# Patient Record
Sex: Male | Born: 1975
Health system: Southern US, Community
[De-identification: ages and names within clinical notes are randomized; demographics above are authoritative.]

## PROBLEM LIST (undated history)

## (undated) DIAGNOSIS — R51 Headache: Secondary | ICD-10-CM

## (undated) DIAGNOSIS — R519 Headache, unspecified: Secondary | ICD-10-CM

## (undated) DIAGNOSIS — T7840XA Allergy, unspecified, initial encounter: Secondary | ICD-10-CM

## (undated) DIAGNOSIS — B019 Varicella without complication: Secondary | ICD-10-CM

## (undated) HISTORY — PX: TONSILLECTOMY: SUR1361

## (undated) HISTORY — DX: Headache, unspecified: R51.9

## (undated) HISTORY — DX: Allergy, unspecified, initial encounter: T78.40XA

## (undated) HISTORY — DX: Headache: R51

## (undated) HISTORY — DX: Varicella without complication: B01.9

---

## 2008-01-19 ENCOUNTER — Ambulatory Visit: Payer: Self-pay | Admitting: Family Medicine

## 2008-01-19 LAB — CONVERTED CEMR LAB: Rapid Strep: POSITIVE

## 2008-03-07 ENCOUNTER — Ambulatory Visit: Payer: Self-pay | Admitting: Family Medicine

## 2008-03-07 LAB — CONVERTED CEMR LAB: Rapid Strep: NEGATIVE

## 2008-11-01 ENCOUNTER — Emergency Department (HOSPITAL_BASED_OUTPATIENT_CLINIC_OR_DEPARTMENT_OTHER): Admission: EM | Admit: 2008-11-01 | Discharge: 2008-11-01 | Payer: Self-pay | Admitting: Emergency Medicine

## 2008-11-01 ENCOUNTER — Ambulatory Visit: Payer: Self-pay | Admitting: Diagnostic Radiology

## 2008-11-02 ENCOUNTER — Emergency Department (HOSPITAL_BASED_OUTPATIENT_CLINIC_OR_DEPARTMENT_OTHER): Admission: EM | Admit: 2008-11-02 | Discharge: 2008-11-02 | Payer: Self-pay | Admitting: Emergency Medicine

## 2009-02-13 ENCOUNTER — Ambulatory Visit: Payer: Self-pay | Admitting: Family Medicine

## 2009-05-17 ENCOUNTER — Ambulatory Visit: Payer: Self-pay | Admitting: Internal Medicine

## 2009-05-21 ENCOUNTER — Ambulatory Visit: Payer: Self-pay | Admitting: Internal Medicine

## 2009-05-21 LAB — CONVERTED CEMR LAB
AST: 16 units/L (ref 0–37)
Alkaline Phosphatase: 75 units/L (ref 39–117)
BUN: 16 mg/dL (ref 6–23)
Bilirubin, Direct: 0.1 mg/dL (ref 0.0–0.3)
CO2: 29 meq/L (ref 19–32)
Calcium: 9.3 mg/dL (ref 8.4–10.5)
Creatinine, Ser: 1.03 mg/dL (ref 0.40–1.50)
Glucose, Bld: 96 mg/dL (ref 70–99)
HDL: 46 mg/dL (ref >39)
HIV-1 RNA Quant, Log: <1.68 (ref <1.68)
Indirect Bilirubin: 0.3 mg/dL (ref 0.0–0.9)
Total Bilirubin: 0.4 mg/dL (ref 0.3–1.2)

## 2009-05-23 ENCOUNTER — Encounter: Payer: Self-pay | Admitting: Internal Medicine

## 2009-05-25 ENCOUNTER — Encounter: Payer: Self-pay | Admitting: Internal Medicine

## 2009-05-25 ENCOUNTER — Telehealth: Payer: Self-pay | Admitting: Internal Medicine

## 2009-06-26 ENCOUNTER — Ambulatory Visit: Payer: Self-pay | Admitting: Internal Medicine

## 2009-06-26 DIAGNOSIS — K5289 Other specified noninfective gastroenteritis and colitis: Secondary | ICD-10-CM

## 2010-04-30 NOTE — Letter (Signed)
Summary: Work Dietitian at [facility] [employer]. [address]   [place], [place] [address]   Phone: [phone]  Fax: [phone]      Today's Date: May 17, 2009     Name of Patient: Peter White   The above named patient had a medical visit today.Please take this into consideration when reviewing the time away from work.  Special Instructions:  [ X] None  [  ] To be off the remainder of today, returning to the normal work / school schedule tomorrow.  [  ] To be off until the next scheduled appointment on ______________________.  [  ] Other ________________________________________________________________ ________________________________________________________________________    Sincerely yours,   Glendell Docker CMA Dr. Thomos Lemons

## 2010-04-30 NOTE — Progress Notes (Signed)
Summary: Lab Results  Phone Note Outgoing Call   Summary of Call: call pt - STD testing - HIV, RPR - negative Initial call taken by: D. Thomos Lemons DO,  May 25, 2009 12:29 PM  Follow-up for Phone Call        patient advised per Dr Artist Pais instructions Follow-up by: Glendell Docker CMA,  May 25, 2009 2:07 PM

## 2010-04-30 NOTE — Consult Note (Signed)
Summary: [facility]  [facility]   Imported By: Lanelle Bal 05/29/2009 11:31:50  _____________________________________________________________________  External Attachment:    Type:   Image     Comment:   External Document

## 2010-04-30 NOTE — Assessment & Plan Note (Signed)
Summary: Abd pain/N&V  last night/hea   Vital Signs:  Patient profile:   35 year old male Height:      69 inches Weight:      148.75 pounds BMI:     22.05 O2 Sat:      100 % on Room air Temp:     98.0 degrees F oral Pulse rate:   87 / minute Pulse rhythm:   regular Resp:     18 per minute BP sitting:   100 / 60  (right arm) Cuff size:   regular  Vitals Entered By: Glendell Docker CMA (June 26, 2009 9:06 AM)  O2 Flow:  Room air CC: Rm 2- Vomiting, Diarrhea Comments sudden onset last night of nausea, vomiting , & diarrhea x 2 each, and headaches, no self care measures   Primary Care Provider:  Dondra Spry DO  CC:  Rm 2- Vomiting and Diarrhea.  History of Present Illness: 35 y/o white male complains of nausea and vomiting. symptoms started midnight - felt nauseated and vomited x 1 diarrhea this AM - loose stools no bloody in stool Headache this AM no unusual food intake -  wife and pt had chicken and noodles he is able to keep down liquids  son had double ear infection last week    Allergies (verified): No Known Drug Allergies  Past History:  Past Medical History: NONE   PMH-FH-SH reviewed for relevance  Family History: mother: age 59- healthy  father: age 77 sister age 57- healthy Family History of Cardiovascular disorder Father has CAD , had rheumatic fever aunts on mother's side - breast ca   Social History: Married (wife Peter White) Never Smoked Alcohol use-no Drug use-no  Occupation:  Curator truck   Physical Exam  General:  alert, well-developed, and well-nourished.   Lungs:  normal respiratory effort and normal breath sounds.   Heart:  normal rate, regular rhythm, and no gallop.   Abdomen:  soft, non-tender, normal bowel sounds, no masses, no guarding, no rigidity, and no rebound tenderness.     Impression & Recommendations:  Problem # 1:  GASTROENTERITIS (ICD-558.9) 35 y/o with nausea, vomiting and diarrhea.  mild dehydration.      His updated medication list for this problem includes:    Ondansetron Hcl 4 Mg Tabs (Ondansetron hcl) ..... One by mouth three times a day as needed nausea  Discussed use of medication and role of diet. Encouraged clear liquids and electrolyte replacement fluids. Instructed to call if any signs of worsening dehydration.   Complete Medication List: 1)  Ondansetron Hcl 4 Mg Tabs (Ondansetron hcl) .... One by mouth three times a day as needed nausea  Patient Instructions: 1)  Increase fluid intake as directed. 2)  BRAT diet for 1-2 days 3)  Call our office if your symptoms do not  improve or gets worse. Prescriptions: ONDANSETRON HCL 4 MG TABS (ONDANSETRON HCL) one by mouth three times a day as needed nausea  #30 x 0   Entered and Authorized by:   D. Thomos Lemons DO   Signed by:   D. Thomos Lemons DO on 06/26/2009   Method used:   Electronically to        [facility] [address] (retail)       [address]       [place], [place]  [address]       Ph: [phone] or [phone]  Fax: [phone]   RxID:   [id number]   Current Allergies (reviewed today): No known allergies

## 2010-04-30 NOTE — Assessment & Plan Note (Signed)
Summary: new to est/mhf   Vital Signs:  Patient profile:   35 year old male Height:      69 inches Weight:      144.25 pounds BMI:     21.38 O2 Sat:      99 % on Room air Temp:     98.3 degrees F oral Pulse rate:   64 / minute Pulse rhythm:   regular Resp:     16 per minute BP sitting:   110 / 60  (right arm) Cuff size:   regular  Vitals Entered By: Glendell Docker CMA (May 17, 2009 1:36 PM)  O2 Flow:  Room air  Primary Care Provider:  D. Thomos Lemons DO  CC:  New Patient.  History of Present Illness: New Patient   35 y/o white male for routine cpx.  no hx of chronic illness  once per month - sharp pain under right rib cage.  symptoms provoked when pt stretches symptoms last 1-2 hrs  works as Curator for trucks  he reports small bumps on right side of scrotum x 1-2 months.  no burning sensation he denies hx of STD's  Preventive Screening-Counseling & Management  Alcohol-Tobacco     Alcohol drinks/day: 0     Smoking Status: never  Caffeine-Diet-Exercise     Caffeine use/day: 3 beverages per week     Does Patient Exercise: no  Allergies (verified): No Known Drug Allergies  Past History:  Past Medical History: NONE   Past Surgical History: Tonsillectomy  PSH reviewed for relevance  Family History: mother: age 41- healthy  father: age 50 sister age 38- healthy Family History of Cardiovascular disorder Father has CAD , had rheumatic fever aunts on mother's side - breast ca  Social History: Married (wife Winton Offord) Never Smoked Alcohol use-no Drug use-no  Occupation:  Curator truck Caffeine use/day:  3 beverages per week Does Patient Exercise:  no  Review of Systems  The patient denies weight loss, weight gain, chest pain, dyspnea on exertion, prolonged cough, abdominal pain, melena, hematochezia, severe indigestion/heartburn, depression, and testicular masses.    Physical Exam  General:  alert, well-developed, and well-nourished.    Head:  normocephalic and atraumatic.   Eyes:  vision grossly intact, pupils equal, pupils round, and pupils reactive to light.   Ears:  R ear normal and L ear normal.   Mouth:  Oral mucosa and oropharynx without lesions or exudates.  Teeth in good repair. Neck:  No deformities, masses, or tenderness noted. Lungs:  Normal respiratory effort, chest expands symmetrically. Lungs are clear to auscultation, no crackles or wheezes. Heart:  Normal rate and regular rhythm. S1 and S2 normal without gallop, murmur, click, rub or other extra sounds. Abdomen:  soft, non-tender, normal bowel sounds, no masses, no hepatomegaly, and no splenomegaly.   Genitalia:  circumcised.  scattered papillar lesion right scrotum and base of penis   Impression & Recommendations:  Problem # 1:  PREVENTIVE HEALTH CARE (ICD-V70.0) Assessment New Reviewed adult health maintenance protocols.  I  suggested annual flu vaccine.  arrange cholesterol screening  Td Booster: Historical (09/19/2008)   Flu Vax: Declined (05/17/2009)     Problem # 2:  CONDYLOMA ACUMINATA (ICD-078.11) scattered papillary lesions on right scrotum and penis.  Probable genital warts.  Refer to urologist for further eval and  tx.  Orders: Urology Referral (Urology)  Patient Instructions: 1)  Please schedule a follow-up appointment as needed. 2)  BMP prior to visit, ICD-9: v70 3)  Lipid  Panel prior to visit, ICD-9: v70 4)  Hepatic Panel prior to visit, ICD-9: v70 5)  Please return for fasting lab work within one week  Current Allergies (reviewed today): No known allergies    Immunization History:  Tetanus/Td Immunization History:    Tetanus/Td:  historical (09/19/2008)  Influenza Immunization History:    Influenza:  declined (05/17/2009)   Contraindications/Deferment of Procedures/Staging:    Test/Procedure: FLU VAX    Reason for deferment: patient declined

## 2010-04-30 NOTE — Letter (Signed)
Summary: Work Dietitian at [facility]. [address]   [place], [place] [address]   Phone: [phone]  Fax: [phone]      Today's Date: June 26, 2009    Name of Patient: Peter White  The above named patient had a medical visit today .Please take this into consideration when reviewing the time away from work.  Special Instructions:  [  None  Arly.Keller  ] To be off the remainder of today, returning to the normal work  schedule tomorrow.  [  ] To be off until the next scheduled appointment on ______________________.  [  ] Other ________________________________________________________________ ________________________________________________________________________     Sincerely yours,   Glendell Docker CMA Dr Thomos Lemons

## 2010-04-30 NOTE — Letter (Signed)
   [place] at [facility] [address] [place], [place]  [address]  [place] Phone: [phone]      May 25, 2009   [facility] [address] [place], [place] [address]  RE:  LAB RESULTS  Dear  Mr. Kluesner,  The following is an interpretation of your most recent lab tests.  Please take note of any instructions provided or changes to medications that have resulted from your lab work.  ELECTROLYTES:  Good - no changes needed  KIDNEY FUNCTION TESTS:  Good - no changes needed  LIVER FUNCTION TESTS:  Good - no changes needed  LIPID PANEL:  Good - no changes needed Triglyceride: 40   Cholesterol: 138   LDL: 84   HDL: 46   Chol/HDL%:  3.0 Ratio       Sincerely Yours,    Dr.  Thomos Lemons

## 2010-11-29 ENCOUNTER — Inpatient Hospital Stay (INDEPENDENT_AMBULATORY_CARE_PROVIDER_SITE_OTHER)
Admission: RE | Admit: 2010-11-29 | Discharge: 2010-11-29 | Disposition: A | Discharge status: Home / Self Care | Payer: 59 | Source: Ambulatory Visit | Attending: Family Medicine | Admitting: Family Medicine

## 2010-11-29 ENCOUNTER — Encounter: Payer: Self-pay | Admitting: Family Medicine

## 2010-11-29 DIAGNOSIS — J069 Acute upper respiratory infection, unspecified: Secondary | ICD-10-CM

## 2010-11-29 LAB — CONVERTED CEMR LAB: Rapid Strep: NEGATIVE

## 2010-12-23 ENCOUNTER — Other Ambulatory Visit: Payer: Self-pay | Admitting: Family Medicine

## 2010-12-23 ENCOUNTER — Encounter: Payer: Self-pay | Admitting: Family Medicine

## 2010-12-23 ENCOUNTER — Inpatient Hospital Stay (INDEPENDENT_AMBULATORY_CARE_PROVIDER_SITE_OTHER)
Admission: RE | Admit: 2010-12-23 | Discharge: 2010-12-23 | Disposition: A | Discharge status: Home / Self Care | Payer: 59 | Source: Ambulatory Visit | Attending: Family Medicine | Admitting: Family Medicine

## 2010-12-23 ENCOUNTER — Ambulatory Visit
Admission: RE | Admit: 2010-12-23 | Discharge: 2010-12-23 | Disposition: A | Discharge status: Home / Self Care | Payer: 59 | Source: Ambulatory Visit | Attending: Family Medicine | Admitting: Family Medicine

## 2010-12-23 DIAGNOSIS — R05 Cough: Secondary | ICD-10-CM

## 2010-12-23 DIAGNOSIS — J209 Acute bronchitis, unspecified: Secondary | ICD-10-CM

## 2011-03-03 NOTE — Progress Notes (Signed)
Summary: COUGH..WSE (rm 2)   Vital Signs:  Patient Profile:   35 Years Old Male CC:      Cough x 3 weeks Height:     69 inches Weight:      146 pounds O2 Sat:      98 % O2 treatment:    Room Air Temp:     98.8 degrees F oral Pulse rate:   67 / minute Resp:     16 per minute BP sitting:   124 / 73  (left arm) Cuff size:   regular  Vitals Entered By: Lajean Saver RN (December 23, 2010 2:42 PM)                  Updated Prior Medication List: No Medications Current Allergies: No known allergies History of Present Illness Chief Complaint: Cough x 3 weeks History of Present Illness:  Subjective:  Patient complains of persistent cough for 3 weeks.  Initial sore throat resolved.  Cough is often non-productive and usually worse at night.  5 days ago he coughed until he vomited, and he often has prolonged coughing episodes.  No shortness of breath, but does have some wheezing at night.  No fevers, chills, and sweats.  He has just developed some mild nasal congestion with clear discharge.  REVIEW OF SYSTEMS Constitutional Symptoms      Denies fever, chills, night sweats, weight loss, weight gain, and fatigue.  Eyes       Denies change in vision, eye pain, eye discharge, glasses, contact lenses, and eye surgery. Ear/Nose/Throat/Mouth       Denies hearing loss/aids, change in hearing, ear pain, ear discharge, dizziness, frequent runny nose, frequent nose bleeds, sinus problems, sore throat, hoarseness, and tooth pain or bleeding.  Respiratory       Complains of dry cough.      Denies productive cough, wheezing, shortness of breath, asthma, bronchitis, and emphysema/COPD.  Cardiovascular       Denies murmurs, chest pain, and tires easily with exhertion.    Gastrointestinal       Denies stomach pain, nausea/vomiting, diarrhea, constipation, blood in bowel movements, and indigestion. Genitourniary       Denies painful urination, blood or discharge from penis, kidney stones, and  loss of urinary control. Neurological       Denies paralysis, seizures, and fainting/blackouts. Musculoskeletal       Denies muscle pain, joint pain, joint stiffness, decreased range of motion, redness, swelling, muscle weakness, and gout.  Skin       Denies bruising, unusual mles/lumps or sores, and hair/skin or nail changes.  Psych       Denies mood changes, temper/anger issues, anxiety/stress, speech problems, depression, and sleep problems. Other Comments: taken mucinex and cold/flu OTC   Past History:  Past Medical History: Reviewed history from 06/26/2009 and no changes required. NONE    Past Surgical History: Reviewed history from 05/17/2009 and no changes required. Tonsillectomy   Family History: Reviewed history from 06/26/2009 and no changes required. mother: age 38- healthy  father: age 32 sister age 67- healthy Family History of Cardiovascular disorder Father has CAD , had rheumatic fever aunts on mother's side - breast ca   Social History: Reviewed history from 06/26/2009 and no changes required. Married (wife Izic Stfort) Never Smoked Alcohol use-no Drug use-no  Occupation:  Curator truck    Objective:  Appearance:  Patient appears healthy, stated age, and in no acute distress  Eyes:  Pupils are equal, round, and  reactive to light and accomodation.  Extraocular movement is intact.  Conjunctivae are not inflamed.  Ears:  Canals normal.  Tympanic membranes normal.   Nose:  Mildly congested turbinates.  No sinus tenderness  Pharynx:  Normal  Neck:  Supple.  No adenopathy is present.  Lungs:  Bibasilar wheezes and rhonchi,  worse on left base.  Breath sounds are equal.  Heart:  Regular rate and rhythm without murmurs, rubs, or gallops.  Abdomen:  Nontender without masses or hepatosplenomegaly.  Bowel sounds are present.  No CVA or flank tenderness.  Extremities:  No edema.   Skin:  No rash Chest X-ray:  IMPRESSION: Normal exam.   Assessment New  Problems: ACUTE BRONCHITIS (ICD-466.0) COUGH (ICD-786.2)   Plan New Medications/Changes: BENZONATATE 200 MG CAPS (BENZONATATE) One by mouth hs as needed cough  #12 x 0, 12/23/2010, Donna Christen MD CLARITHROMYCIN 500 MG TABS (CLARITHROMYCIN) One Tab by mouth two times a day  #20 x 0, 12/23/2010, Donna Christen MD  New Orders: T-Chest x-ray, 2 views [71020] Pulse Oximetry [94760] Est. Patient Level IV [82956] Planning Comments:   Begin Biaxin, expectorant, cough suppressant at bedtime.  Increase fluid intake Followup with PCP if not improving 7 to 10 days  Recommend flu shot when well.   The patient and/or caregiver has been counseled thoroughly with regard to medications prescribed including dosage, schedule, interactions, rationale for use, and possible side effects and they verbalize understanding.  Diagnoses and expected course of recovery discussed and will return if not improved as expected or if the condition worsens. Patient and/or caregiver verbalized understanding.  Prescriptions: BENZONATATE 200 MG CAPS (BENZONATATE) One by mouth hs as needed cough  #12 x 0   Entered and Authorized by:   Donna Christen MD   Signed by:   Donna Christen MD on 12/23/2010   Method used:   Print then Give to Patient   RxID:   [id number] CLARITHROMYCIN 500 MG TABS (CLARITHROMYCIN) One Tab by mouth two times a day  #20 x 0   Entered and Authorized by:   Donna Christen MD   Signed by:   Donna Christen MD on 12/23/2010   Method used:   Print then Give to Patient   RxID:   [id number]   Patient Instructions: 1)  Take Mucinex (guaifenesin) twice daily for congestion. 2)  Increase fluid intake  3)  Followup with family doctor if not improving 7 to 10 days.   Orders Added: 1)  T-Chest x-ray, 2 views [71020] 2)  Pulse Oximetry [94760] 3)  Est. Patient Level IV [66440]

## 2011-03-03 NOTE — Progress Notes (Signed)
Summary: Possible Strep Throat Room 5   Vital Signs:  Patient Profile:   35 Years Old Male CC:      Sore throat, headache, congestion since last night, Child tested positive for strep yesterday Height:     69 inches Weight:      146 pounds O2 Sat:      98 % O2 treatment:    Room Air Temp:     98.4 degrees F oral Pulse rate:   69 / minute Pulse rhythm:   regular Resp:     14 per minute BP sitting:   110 / 75  (left arm) Cuff size:   regular  Vitals Entered By: Emilio Math (November 29, 2010 10:00 AM)                  Current Allergies: No known allergies History of Present Illness Chief Complaint: Sore throat, headache, congestion since last night, Child tested positive for strep yesterday History of Present Illness:  Subjective: Patient complains of sore throat and URI symptoms that started yesterday.  His child had tested positive for strep + sore throat + cough No pleuritic pain No wheezing +nasal congestion ? post-nasal drainage No sinus pain/pressure No itchy/red eyes No earache No hemoptysis No SOB No fever/chills No nausea No vomiting No abdominal pain No diarrhea No skin rashes + fatigue No myalgias No headache Used OTC meds without relief   REVIEW OF SYSTEMS Constitutional Symptoms      Denies fever, chills, night sweats, weight loss, weight gain, and fatigue.  Eyes       Denies change in vision, eye pain, eye discharge, glasses, contact lenses, and eye surgery. Ear/Nose/Throat/Mouth       Complains of frequent runny nose, sinus problems, and sore throat.      Denies hearing loss/aids, change in hearing, ear pain, ear discharge, dizziness, frequent nose bleeds, hoarseness, and tooth pain or bleeding.  Respiratory       Complains of dry cough.      Denies productive cough, wheezing, shortness of breath, asthma, bronchitis, and emphysema/COPD.  Cardiovascular       Denies murmurs, chest pain, and tires easily with exhertion.     Gastrointestinal       Denies stomach pain, nausea/vomiting, diarrhea, constipation, blood in bowel movements, and indigestion. Genitourniary       Denies painful urination, kidney stones, and loss of urinary control. Neurological       Complains of headaches.      Denies paralysis, seizures, and fainting/blackouts. Musculoskeletal       Denies muscle pain, joint pain, joint stiffness, decreased range of motion, redness, swelling, muscle weakness, and gout.  Skin       Denies bruising, unusual mles/lumps or sores, and hair/skin or nail changes.  Psych       Denies mood changes, temper/anger issues, anxiety/stress, speech problems, depression, and sleep problems.  Past History:  Past Medical History: Reviewed history from 06/26/2009 and no changes required. NONE    Past Surgical History: Reviewed history from 05/17/2009 and no changes required. Tonsillectomy   Family History: Reviewed history from 06/26/2009 and no changes required. mother: age 30- healthy  father: age 8 sister age 50- healthy Family History of Cardiovascular disorder Father has CAD , had rheumatic fever aunts on mother's side - breast ca   Social History: Reviewed history from 06/26/2009 and no changes required. Married (wife Arber Wiemers) Never Smoked Alcohol use-no Drug use-no  Occupation:  Curator truck  Objective:  Appearance:  Patient appears healthy, stated age, and in no acute distress  Eyes:  Pupils are equal, round, and reactive to light and accomodation.  Extraocular movement is intact.  Conjunctivae are not inflamed.  Ears:  Canals normal.  Tympanic membranes normal.   Nose:  Mildly congested turbinates.  No sinus tenderness  Pharynx:  Normal Neck:  Supple.  Slightly tender shotty posterior nodes are palpated bilaterally.  Lungs:  Clear to auscultation.  Breath sounds are equal.  Heart:  Regular rate and rhythm without murmurs, rubs, or gallops.  Abdomen:  Nontender without  masses or hepatosplenomegaly.  Bowel sounds are present.  No CVA or flank tenderness.  Skin:  No rash Rapid strep test negative  Assessment New Problems: UPPER RESPIRATORY INFECTION, ACUTE (ICD-465.9)  NO EVIDENCE BACTERIAL INFECTION  Plan New Orders: Pulse Oximetry (single measurment) [94760] Rapid Strep [[address]] Est. Patient Level III [60454] Planning Comments:   Treat symptomatically for now:  Increase fluid intake, begin expectorant/decongestant, topical decongestant,  cough suppressant at bedtime.   Followup with PCP if not improving 7 to 10 days.   The patient and/or caregiver has been counseled thoroughly with regard to medications prescribed including dosage, schedule, interactions, rationale for use, and possible side effects and they verbalize understanding.  Diagnoses and expected course of recovery discussed and will return if not improved as expected or if the condition worsens. Patient and/or caregiver verbalized understanding.   Patient Instructions: 1)  Take Mucinex D (guaifenesin with decongestant) twice daily for congestion. 2)  Increase fluid intake, rest. 3)  May use Afrin nasal spray (or generic oxymetazoline) twice daily for about 5 days.  Also recommend using saline nasal spray several times daily and/or saline nasal irrigation. 4)  May take Ibuprofen 200mg , 4 tabs every 8 hours with food for sore throat. 5)  Followup with family doctor if not improving 7 to 10 days.   Orders Added: 1)  Pulse Oximetry (single measurment) [94760] 2)  Rapid Strep [09811] 3)  Est. Patient Level III [91478]    Laboratory Results  Date/Time Received: November 29, 2010 10:04 AM  Date/Time Reported: November 29, 2010 10:04 AM   Other Tests  Rapid Strep: negative

## 2013-04-07 ENCOUNTER — Emergency Department (INDEPENDENT_AMBULATORY_CARE_PROVIDER_SITE_OTHER): Payer: 59

## 2013-04-07 ENCOUNTER — Encounter: Payer: Self-pay | Admitting: Emergency Medicine

## 2013-04-07 ENCOUNTER — Emergency Department
Admission: EM | Admit: 2013-04-07 | Discharge: 2013-04-07 | Disposition: A | Discharge status: Home / Self Care | Payer: 59 | Source: Home / Self Care | Attending: Emergency Medicine | Admitting: Emergency Medicine

## 2013-04-07 DIAGNOSIS — R509 Fever, unspecified: Secondary | ICD-10-CM

## 2013-04-07 DIAGNOSIS — R059 Cough, unspecified: Secondary | ICD-10-CM

## 2013-04-07 DIAGNOSIS — R05 Cough: Secondary | ICD-10-CM

## 2013-04-07 DIAGNOSIS — J209 Acute bronchitis, unspecified: Secondary | ICD-10-CM

## 2013-04-07 LAB — POCT INFLUENZA A/B
Influenza A, POC: NEGATIVE
Influenza B, POC: NEGATIVE

## 2013-04-07 MED ORDER — BENZONATATE 100 MG PO CAPS: 100.0000 mg | ORAL_CAPSULE | Freq: Three times a day (TID) | ORAL | Status: DC

## 2013-04-07 MED ORDER — PSEUDOEPHEDRINE-GUAIFENESIN ER 120-1200 MG PO TB12: 1.0000 | ORAL_TABLET | Freq: Two times a day (BID) | ORAL | Status: DC

## 2013-04-07 MED ORDER — AZITHROMYCIN 250 MG PO TABS: ORAL_TABLET | ORAL | Status: DC

## 2013-04-07 NOTE — ED Notes (Signed)
Pt c/o cough, fever and body aches x 12/25, with some improvement. Now s/s worse x 1 day with fever returning. No OTC meds today.

## 2013-04-07 NOTE — ED Provider Notes (Addendum)
CSN: [id number]     Arrival date & time 04/07/13  [id number] History   First MD Initiated Contact with Patient 04/07/13 1005     Chief Complaint  Patient presents with  . Fever  . Cough   HPI    URI HISTORY  Peter White is a 38 y.o. male who complains of onset of cold symptoms for 14 days.  Had been using over-the-counter treatment which helps a little bit. cough, fever and body aches x 12/25, with some improvement. Since last night, acute symptoms of severe cough and chest congestion and myalgias and spiking fever last night. No OTC meds today.  Positive chills/sweats +  Fever  Minimal  Nasal congestion Mild  Discolored Post-nasal drainage No sinus pain/pressure Minimal sore throat  +  cough No wheezing Positive chest congestion No hemoptysis No shortness of breath No pleuritic pain  No itchy/red eyes No earache  No nausea No vomiting No abdominal pain No diarrhea  No skin rashes +  Fatigue No myalgias No headache   History reviewed. No pertinent past medical history. History reviewed. No pertinent past surgical history. Family History  Problem Relation Age of Onset  . Heart disease Father   . Rheum arthritis Father    History  Substance Use Topics  . Smoking status: Never Smoker   . Smokeless tobacco: Not on file  . Alcohol Use: No    Review of Systems  All other systems reviewed and are negative.    Allergies  Review of patient's allergies indicates no known allergies.  Home Medications   Current Outpatient Rx  Name  Route  Sig  Dispense  Refill  . azithromycin (ZITHROMAX Z-PAK) 250 MG tablet      Take 2 tablets on day one, then 1 tablet daily on days 2 through 5   1 each   0   . benzonatate (TESSALON) 100 MG capsule   Oral   Take 1 capsule (100 mg total) by mouth every 8 (eight) hours. As needed for cough   21 capsule   0   . Pseudoephedrine-Guaifenesin [phone] MG TB12   Oral   Take 1 tablet by mouth 2 (two) times daily. As needed for  congestion. (Caution: Do not take if blood pressure is elevated)   20 each   0    BP 110/73  Pulse 88  Temp(Src) 98.5 F (36.9 C) (Oral)  Resp 18  Ht 5\' 9"  (1.753 m)  Wt 141 lb (63.957 kg)  BMI 20.81 kg/m2  SpO2 99% Physical Exam  Nursing note and vitals reviewed. Constitutional: He appears well-developed and well-nourished.  Non-toxic appearance. He appears ill (very fatigued, but no cardiorespiratory distress). No distress.  HENT:  Head: Normocephalic and atraumatic.  Right Ear: Tympanic membrane and external ear normal.  Left Ear: Tympanic membrane and external ear normal.  Nose: Rhinorrhea present.  Mouth/Throat: Mucous membranes are normal. Posterior oropharyngeal erythema (minimal injection,  tonsils surgically absent) present. No oropharyngeal exudate or posterior oropharyngeal edema.  Eyes: Conjunctivae are normal. Right eye exhibits no discharge. Left eye exhibits no discharge. No scleral icterus.  Neck: Neck supple.  Cardiovascular: Normal rate, regular rhythm and normal heart sounds.   No murmur heard. Pulmonary/Chest: No stridor. No respiratory distress. He has no decreased breath sounds. He has no wheezes. He has rhonchi (Harsh, diffuse). He has rales (questionable minimal rales right base) in the right lower field.  Abdominal: Soft. There is no tenderness.  Musculoskeletal: He exhibits no edema.  Lymphadenopathy:  He has cervical adenopathy (mild shoddy anterior cervical nodes).  Neurological: He is alert.  Skin: Skin is warm and intact. No rash noted. He is diaphoretic.  Psychiatric: He has a normal mood and affect.    ED Course  Procedures (including critical care time) Labs Review Labs Reviewed  POCT INFLUENZA A/B   Imaging Review Dg Chest 2 View  04/07/2013   CLINICAL DATA:  Cough, congestion, fever for 2 weeks  EXAM: CHEST  2 VIEW  COMPARISON:  12/23/2010  FINDINGS: The heart size and vascular pattern are normal. The lungs are clear. Bony thorax is  intact.  IMPRESSION: No active cardiopulmonary disease.   Electronically Signed   By: Esperanza Heir M.D.   On: 04/07/2013 10:52    EKG Interpretation    Date/Time:    Ventricular Rate:    PR Interval:    QRS Duration:   QT Interval:    QTC Calculation:   R Axis:     Text Interpretation:              MDM   1. Acute bronchitis     rapid flu tests negative Chest x-ray negative.  Treatment options discussed, as well as risks, benefits, alternatives. Patient voiced understanding and agreement with the following plans: Zpak. Tessalon Pearles prn cough. Mucinex-D  Precautions discussed. Red flags discussed. Questions invited and answered. Patient voiced understanding and agreement.    Lajean Manes, MD 04/07/13 1228  Lajean Manes, MD 04/07/13 1229  Lajean Manes, MD 04/07/13 1231  Lajean Manes, MD 04/07/13 [phone]

## 2018-05-26 ENCOUNTER — Encounter: Payer: Self-pay | Admitting: Family Medicine

## 2018-05-26 ENCOUNTER — Ambulatory Visit (INDEPENDENT_AMBULATORY_CARE_PROVIDER_SITE_OTHER): Payer: No Typology Code available for payment source | Admitting: Family Medicine

## 2018-05-26 VITALS — BP 128/80 | HR 71 | Ht 69.0 in | Wt 151.0 lb

## 2018-05-26 DIAGNOSIS — M25521 Pain in right elbow: Secondary | ICD-10-CM | POA: Diagnosis not present

## 2018-05-26 DIAGNOSIS — G8929 Other chronic pain: Secondary | ICD-10-CM | POA: Insufficient documentation

## 2018-05-26 DIAGNOSIS — M25522 Pain in left elbow: Secondary | ICD-10-CM

## 2018-05-26 DIAGNOSIS — Z0000 Encounter for general adult medical examination without abnormal findings: Secondary | ICD-10-CM | POA: Insufficient documentation

## 2018-05-26 DIAGNOSIS — M2550 Pain in unspecified joint: Secondary | ICD-10-CM | POA: Diagnosis not present

## 2018-05-26 LAB — CBC
HEMATOCRIT: 43.8 % (ref 39.0–52.0)
Hemoglobin: 15.2 g/dL (ref 13.0–17.0)
MCHC: 34.6 g/dL (ref 30.0–36.0)
MCV: 85.4 fl (ref 78.0–100.0)
Platelets: 255 10*3/uL (ref 150.0–400.0)
RBC: 5.13 Mil/uL (ref 4.22–5.81)
RDW: 13.7 % (ref 11.5–15.5)
WBC: 3.9 10*3/uL — ABNORMAL LOW (ref 4.0–10.5)

## 2018-05-26 LAB — URINALYSIS, ROUTINE W REFLEX MICROSCOPIC
BILIRUBIN URINE: NEGATIVE
HGB URINE DIPSTICK: NEGATIVE
Ketones, ur: NEGATIVE
LEUKOCYTE UA: NEGATIVE
NITRITE: NEGATIVE
RBC / HPF: NONE SEEN (ref 0–?)
Specific Gravity, Urine: 1.02 (ref 1.000–1.030)
Total Protein, Urine: NEGATIVE
Urine Glucose: NEGATIVE
Urobilinogen, UA: 0.2 (ref 0.0–1.0)
pH: 7 (ref 5.0–8.0)

## 2018-05-26 LAB — LIPID PANEL
CHOL/HDL RATIO: 3
CHOLESTEROL: 148 mg/dL (ref 0–200)
HDL: 49.8 mg/dL (ref >39.00)
LDL CALC: 86 mg/dL (ref 0–99)
NonHDL: 98.04
TRIGLYCERIDES: 58 mg/dL (ref 0.0–149.0)
VLDL: 11.6 mg/dL (ref 0.0–40.0)

## 2018-05-26 LAB — COMPREHENSIVE METABOLIC PANEL
ALBUMIN: 4.4 g/dL (ref 3.5–5.2)
ALT: 28 U/L (ref 0–53)
AST: 22 U/L (ref 0–37)
Alkaline Phosphatase: 84 U/L (ref 39–117)
BUN: 22 mg/dL (ref 6–23)
CALCIUM: 9.1 mg/dL (ref 8.4–10.5)
CO2: 30 meq/L (ref 19–32)
CREATININE: 1.07 mg/dL (ref 0.40–1.50)
Chloride: 105 mEq/L (ref 96–112)
GFR: 75.05 mL/min (ref >60.00)
Glucose, Bld: 92 mg/dL (ref 70–99)
POTASSIUM: 3.8 meq/L (ref 3.5–5.1)
SODIUM: 143 meq/L (ref 135–145)
Total Bilirubin: 0.7 mg/dL (ref 0.2–1.2)
Total Protein: 6.9 g/dL (ref 6.0–8.3)

## 2018-05-26 LAB — SEDIMENTATION RATE: SED RATE: 5 mm/h (ref 0–15)

## 2018-05-26 NOTE — Progress Notes (Signed)
Established Patient Office Visit  Subjective:  Patient ID: RY KHAM, male    DOB: April 17, 1975  Age: 43 y.o. MRN: [id number]  CC:  Chief Complaint  Patient presents with  . Establish Care  . joint pain    x a few months, no known injury    HPI Peter White presents for establishment of care with a complete physical exam.  He also has been concerned about joint pains in his shoulders, elbows wrists and knees over the last several months.  There is some morning stiffness.  He has seen no erythema or swelling.  He has tried vitamin D with some relief.  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  His father is 28 and has rheumatoid arthritis heart disease and hypertension.  Mom has had issues with hypertension as well.  Patient is married and lives with his wife and children.  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  He is quite active on his job.  He gets no regular exercise.  He does not drink smoke or use illicit drugs.  Patient's wife has been concerned about his hearing but patient has not been concerned about his hearing.  She has not commented that he is turning the volume on the TV up.  History reviewed. No pertinent past medical history.  History reviewed. No pertinent surgical history.  Family History  Problem Relation Age of Onset  . Heart disease Father   . Rheum arthritis Father     Social History   Socioeconomic History  . Marital status: Married    Spouse name: Not on file  . Number of children: Not on file  . Years of education: Not on file  . Highest education level: Not on file  Occupational History  . Not on file  Social Needs  . Financial resource strain: Not on file  . Food insecurity:    Worry: Not on file    Inability: Not on file  . Transportation needs:    Medical: Not on file    Non-medical: Not on file  Tobacco Use  . Smoking status: Never Smoker  . Smokeless tobacco: Never Used  Substance and  Sexual Activity  . Alcohol use: No  . Drug use: No  . Sexual activity: Not on file  Lifestyle  . Physical activity:    Days per week: Not on file    Minutes per session: Not on file  . Stress: Not on file  Relationships  . Social connections:    Talks on phone: Not on file    Gets together: Not on file    Attends religious service: Not on file    Active member of club or organization: Not on file    Attends meetings of clubs or organizations: Not on file    Relationship status: Not on file  . Intimate partner violence:    Fear of current or ex partner: Not on file    Emotionally abused: Not on file    Physically abused: Not on file    Forced sexual activity: Not on file  Other Topics Concern  . Not on file  Social History Narrative  . Not on file    Outpatient Medications Prior to Visit  Medication Sig Dispense Refill  . azithromycin (ZITHROMAX Z-PAK) 250 MG tablet Take 2 tablets on day one, then 1 tablet daily on days 2 through 5 1 each 0  . benzonatate (TESSALON)  100 MG capsule Take 1 capsule (100 mg total) by mouth every 8 (eight) hours. As needed for cough 21 capsule 0  . Pseudoephedrine-Guaifenesin [phone] MG TB12 Take 1 tablet by mouth 2 (two) times daily. As needed for congestion. (Caution: Do not take if blood pressure is elevated) 20 each 0   No facility-administered medications prior to visit.     No Known Allergies  ROS Review of Systems  Constitutional: Negative for chills, diaphoresis, fatigue, fever and unexpected weight change.  HENT: Negative.   Eyes: Negative for photophobia and visual disturbance.  Respiratory: Negative.   Cardiovascular: Negative.   Gastrointestinal: Negative.   Endocrine: Negative for polyphagia and polyuria.  Genitourinary: Negative for difficulty urinating, frequency and urgency.  Musculoskeletal: Positive for arthralgias and myalgias.  Allergic/Immunologic: Negative for immunocompromised state.  Neurological: Negative for  seizures, numbness and headaches.  Hematological: Does not bruise/bleed easily.  Psychiatric/Behavioral: Negative.       Objective:    Physical Exam  Constitutional: He is oriented to person, place, and time. He appears well-developed and well-nourished. No distress.  HENT:  Head: Normocephalic and atraumatic.  Right Ear: External ear normal.  Left Ear: External ear normal.  Mouth/Throat: Oropharynx is clear and moist. No oropharyngeal exudate.  Eyes: Pupils are equal, round, and reactive to light. Conjunctivae and EOM are normal. Right eye exhibits no discharge. Left eye exhibits no discharge. No scleral icterus.  Neck: Neck supple. No JVD present. No tracheal deviation present. No thyromegaly present.  Cardiovascular: Normal rate, regular rhythm and normal heart sounds.  Pulmonary/Chest: Effort normal and breath sounds normal. No stridor.  Abdominal: Soft. Bowel sounds are normal. He exhibits no distension. There is no abdominal tenderness. There is no rebound and no guarding. Hernia confirmed negative in the right inguinal area and confirmed negative in the left inguinal area.  Genitourinary: Right testis shows no mass, no swelling and no tenderness. Right testis is descended. Left testis shows no mass, no swelling and no tenderness. Left testis is descended. Circumcised. No hypospadias, penile erythema or penile tenderness. No discharge found.  Musculoskeletal:     Right elbow: Normal.    Left elbow: Normal.     Right wrist: Normal.     Left wrist: Normal.     Right knee: He exhibits normal range of motion.     Left knee: He exhibits normal range of motion.  Lymphadenopathy:    He has no cervical adenopathy.       Right: No inguinal adenopathy present.       Left: No inguinal adenopathy present.  Neurological: He is alert and oriented to person, place, and time.  Skin: Skin is warm and dry. He is not diaphoretic.  Psychiatric: He has a normal mood and affect. His behavior is  normal.    BP 128/80   Pulse 71   Ht  (1.753 m)   Wt 151 lb (68.5 kg)   SpO2 98%   BMI 22.30 kg/m  Wt Readings from Last 3 Encounters:  05/26/18 151 lb (68.5 kg)  04/07/13 141 lb (64 kg)  12/23/10 146 lb (66.2 kg)   BP Readings from Last 3 Encounters:  05/26/18 128/80  04/07/13 110/73  12/23/10 124/73   Guideline developer:  UpToDate (see UpToDate for funding source) Date Released: June 2014  Health Maintenance Due  Topic Date Due  . HIV Screening  04/29/1989  . INFLUENZA VACCINE  10/29/2017    There are no preventive care reminders to display for this  patient.  No results found for: TSH No results found for: WBC, HGB, HCT, MCV, PLT Lab Results  Component Value Date   NA 143 05/21/2009   K 4.0 05/21/2009   CO2 29 05/21/2009   GLUCOSE 96 05/21/2009   BUN 16 05/21/2009   CREATININE 1.03 05/21/2009   BILITOT 0.4 05/21/2009   ALKPHOS 75 05/21/2009   AST 16 05/21/2009   ALT 14 05/21/2009   PROT 7.0 05/21/2009   ALBUMIN 4.5 05/21/2009   CALCIUM 9.3 05/21/2009   Lab Results  Component Value Date   CHOL 138 05/21/2009   Lab Results  Component Value Date   HDL 46 05/21/2009   Lab Results  Component Value Date   LDLCALC 84 05/21/2009   Lab Results  Component Value Date   TRIG 40 05/21/2009   Lab Results  Component Value Date   CHOLHDL 3.0 Ratio 05/21/2009   No results found for: HGBA1C    Assessment & Plan:   Problem List Items Addressed This Visit      Other   Healthcare maintenance - Primary   Relevant Orders   CBC   Comprehensive metabolic panel   Lipid panel   HIV Antibody (routine testing w rflx)   Urinalysis, Routine w reflex microscopic   Arthralgia of both elbows   Relevant Orders   HIV Antibody (routine testing w rflx)   TSH   VITAMIN D 25 Hydroxy (Vit-D Deficiency, Fractures)   Rheumatoid Factor   Sedimentation rate   Lyme Disease, Western Blot   Chronic joint pain   Relevant Orders   CBC   HIV Antibody (routine  testing w rflx)   TSH   VITAMIN D 25 Hydroxy (Vit-D Deficiency, Fractures)   Rheumatoid Factor   Sedimentation rate   Lyme Disease, Western Blot      No orders of the defined types were placed in this encounter.   Follow-up: Return in about 3 months (around 08/24/2018).

## 2018-05-26 NOTE — Patient Instructions (Signed)

## 2018-05-27 LAB — VITAMIN D 25 HYDROXY (VIT D DEFICIENCY, FRACTURES): VITD: 31.72 ng/mL (ref 30.00–100.00)

## 2018-05-27 LAB — TSH: TSH: 0.72 u[IU]/mL (ref 0.35–4.50)

## 2018-05-27 LAB — HIV ANTIBODY (ROUTINE TESTING W REFLEX): HIV 1&2 Ab, 4th Generation: NONREACTIVE

## 2018-05-27 LAB — RHEUMATOID FACTOR

## 2018-05-28 LAB — LYME DISEASE, WESTERN BLOT
IGG P30 AB.: ABSENT
IGG P39 AB.: ABSENT
IGG P45 AB.: ABSENT
IGG P58 AB.: ABSENT
IGM P23 AB.: ABSENT
IgG P23 Ab.: ABSENT
IgG P41 Ab.: ABSENT
IgG P66 Ab.: ABSENT
IgG P93 Ab.: ABSENT
IgM P39 Ab.: ABSENT
IgM P41 Ab.: ABSENT
LYME IGG WB: NEGATIVE
Lyme IgM Wb: NEGATIVE

## 2018-07-03 ENCOUNTER — Telehealth: Payer: No Typology Code available for payment source | Admitting: Physician Assistant

## 2018-07-03 DIAGNOSIS — R059 Cough, unspecified: Secondary | ICD-10-CM

## 2018-07-03 DIAGNOSIS — R197 Diarrhea, unspecified: Secondary | ICD-10-CM

## 2018-07-03 DIAGNOSIS — R0989 Other specified symptoms and signs involving the circulatory and respiratory systems: Secondary | ICD-10-CM | POA: Diagnosis not present

## 2018-07-03 DIAGNOSIS — R05 Cough: Secondary | ICD-10-CM

## 2018-07-03 NOTE — Progress Notes (Signed)
E-Visit for [employer] Virus Screening Giving the respiratory symptoms along with diarrhea, these are concerning for coronavirus.  Based on your current symptoms, you may very well have the virus, however your symptoms are mild. Currently, not all patients are being tested. If the symptoms are mild and there is not a known exposure, performing the test is not indicated.   Coronavirus disease 2019 (COVID-19) is a respiratory illness that can spread from person to person. The virus that causes COVID-19 is a new virus that was first identified in the country of [place] but is now found in multiple other countries and has spread to the [place].  Symptoms associated with the virus are mild to severe fever, cough, and shortness of breath. There is currently no vaccine to protect against COVID-19, and there is no specific antiviral treatment for the virus.   To be considered HIGH RISK for Coronavirus (COVID-19), you have to meet the following criteria:  . Traveled to [place], [place], [place], [place] or [place]; or in the [place] to [place], [place], [place], or [place]; and have fever, cough, and shortness of breath within the last 2 weeks of travel OR  . Been in close contact with a person diagnosed with COVID-19 within the last 2 weeks and have fever, cough, and shortness of breath  . IF YOU DO NOT MEET THESE CRITERIA, YOU ARE CONSIDERED LOW RISK FOR COVID-19.   It is vitally important that if you feel that you have an infection such as this virus or any other virus that you stay home and away from places where you may spread it to others.  You should self-quarantine for 14 days if you have symptoms that could potentially be coronavirus and avoid contact with people age 47 and older.   You can use medication such as Delsym for cough. You may also take acetaminophen (Tylenol) as needed for fever.  I would have someone pick up these for you and drop them off at the house.   Reduce your risk  of any infection by using the same precautions used for avoiding the common cold or flu:  Marland Kitchen Wash your hands often with soap and warm water for at least 20 seconds.  If soap and water are not readily available, use an alcohol-based hand sanitizer with at least 60% alcohol.  . If coughing or sneezing, cover your mouth and nose by coughing or sneezing into the elbow areas of your shirt or coat, into a tissue or into your sleeve (not your hands). . Avoid shaking hands with others and consider head nods or verbal greetings only. . Avoid touching your eyes, nose, or mouth with unwashed hands.  . Avoid close contact with people who are sick. . Avoid places or events with large numbers of people in one location, like concerts or sporting events. . Carefully consider travel plans you have or are making. . If you are planning any travel outside or inside the [place], visit the CDC's Travelers' Health webpage for the latest health notices. . If you have some symptoms but not all symptoms, continue to monitor at home and seek medical attention if your symptoms worsen. . If you are having a medical emergency, call 911.  HOME CARE . Only take medications as instructed by your medical team. . Drink plenty of fluids and get plenty of rest. . A steam or ultrasonic humidifier can help if you have congestion.   GET HELP RIGHT AWAY IF: . You develop  worsening fever. . You become short of breath . You cough up blood. . Your symptoms become more severe MAKE SURE YOU   Understand these instructions.  Will watch your condition.  Will get help right away if you are not doing well or get worse.  Your e-visit answers were reviewed by a board certified advanced clinical practitioner to complete your personal care plan.  Depending on the condition, your plan could have included both over the counter or prescription medications.  If there is a problem please reply once you have received a response from your  provider. Your safety is important to [place].  If you have drug allergies check your prescription carefully.    You can use MyChart to ask questions about today's visit, request a non-urgent call back, or ask for a work or school excuse for 24 hours related to this e-Visit. If it has been greater than 24 hours you will need to follow up with your provider, or enter a new e-Visit to address those concerns. You will get an e-mail in the next two days asking about your experience.  I hope that your e-visit has been valuable and will speed your recovery. Thank you for using e-visits.

## 2018-07-03 NOTE — Progress Notes (Signed)
I have spent 5 minutes in review of e-visit questionnaire, review and updating patient chart, medical decision making and response to patient.   Sigurd Pugh Cody Arienne Gartin, PA-C    

## 2018-07-15 ENCOUNTER — Telehealth: Payer: Self-pay | Admitting: Family Medicine

## 2018-07-15 NOTE — Telephone Encounter (Signed)
Tried to call patient about mychart message to set up an appointment, left message

## 2018-07-16 ENCOUNTER — Ambulatory Visit (INDEPENDENT_AMBULATORY_CARE_PROVIDER_SITE_OTHER): Payer: No Typology Code available for payment source | Admitting: Family Medicine

## 2018-07-16 ENCOUNTER — Encounter: Payer: Self-pay | Admitting: Family Medicine

## 2018-07-16 VITALS — Ht 69.0 in

## 2018-07-16 DIAGNOSIS — J4521 Mild intermittent asthma with (acute) exacerbation: Secondary | ICD-10-CM

## 2018-07-16 DIAGNOSIS — J45909 Unspecified asthma, uncomplicated: Secondary | ICD-10-CM | POA: Insufficient documentation

## 2018-07-16 MED ORDER — PREDNISONE 10 MG PO TABS: ORAL_TABLET | ORAL | 0 refills | Status: DC

## 2018-07-16 MED FILL — predniSONE 10 MG TABS: 10 | 7 days supply | Qty: 7 | Fill #0

## 2018-07-16 NOTE — Progress Notes (Signed)
Established Patient Office Visit  Subjective:  Patient ID: Peter White, male    DOB: 1975-05-21  Age: 43 y.o. MRN: [id number]  CC:  Chief Complaint  Patient presents with  . Shortness of Breath    HPI Peter White presents for evaluation and treatment of some lingering tightness in his chest that has been associated with his spring allergies that seem to have been a little worse this year.  He had just started Xyzal about a week ago for significantly itchy watery eyes, rhinorrhea scratchy throat postnasal drip with cough.  There is been no fever chills myalgias or arthralgias.  He was seen a few weeks ago and it was recommended that he isolate for 2 weeks because of the possibility that this may have represented a COVID infection.  He never went on to develop any other symptoms that would suggest that.  He has no asthma history.  He has never smoked.  He does admit some worry associated with the pandemic.  He does work nights.  Past Medical History:  Diagnosis Date  . Allergy   . Chicken pox   . Frequent headaches     Past Surgical History:  Procedure Laterality Date  . TONSILLECTOMY      Family History  Problem Relation Age of Onset  . Heart disease Father   . Rheum arthritis Father   . Arthritis Father   . Hyperlipidemia Father   . Hypertension Father   . Depression Mother   . Hyperlipidemia Mother   . Hypertension Mother     Social History   Socioeconomic History  . Marital status: Married    Spouse name: Not on file  . Number of children: Not on file  . Years of education: Not on file  . Highest education level: Not on file  Occupational History  . Not on file  Social Needs  . Financial resource strain: Not on file  . Food insecurity:    Worry: Not on file    Inability: Not on file  . Transportation needs:    Medical: Not on file    Non-medical: Not on file  Tobacco Use  . Smoking status: Never Smoker  . Smokeless tobacco: Never Used   Substance and Sexual Activity  . Alcohol use: No  . Drug use: No  . Sexual activity: Yes    Partners: Female  Lifestyle  . Physical activity:    Days per week: Not on file    Minutes per session: Not on file  . Stress: Not on file  Relationships  . Social connections:    Talks on phone: Not on file    Gets together: Not on file    Attends religious service: Not on file    Active member of club or organization: Not on file    Attends meetings of clubs or organizations: Not on file    Relationship status: Not on file  . Intimate partner violence:    Fear of current or ex partner: Not on file    Emotionally abused: Not on file    Physically abused: Not on file    Forced sexual activity: Not on file  Other Topics Concern  . Not on file  Social History Narrative  . Not on file    No outpatient medications prior to visit.   No facility-administered medications prior to visit.     No Known Allergies  ROS Review of Systems  Constitutional: Negative for chills, diaphoresis, fatigue, fever  and unexpected weight change.  HENT: Positive for congestion, postnasal drip, rhinorrhea and sneezing. Negative for sinus pain.   Eyes: Positive for itching. Negative for photophobia and visual disturbance.  Respiratory: Positive for cough and chest tightness. Negative for shortness of breath and wheezing.   Genitourinary: Negative.   Musculoskeletal: Negative for arthralgias and myalgias.  Hematological: Negative.   Psychiatric/Behavioral: The patient is nervous/anxious.       Objective:    Physical Exam  Constitutional: He is oriented to person, place, and time. He appears well-developed and well-nourished. No distress.  HENT:  Head: Normocephalic and atraumatic.  Right Ear: External ear normal.  Left Ear: External ear normal.  Eyes: No scleral icterus.  Neck: No JVD present. No tracheal deviation present.  Pulmonary/Chest: Effort normal. No stridor. No respiratory distress.   Neurological: He is alert and oriented to person, place, and time.  Skin: He is not diaphoretic.  Psychiatric: He has a normal mood and affect. His behavior is normal.    Ht 5\' 9"  (1.753 m)   BMI 22.30 kg/m  Wt Readings from Last 3 Encounters:  05/26/18 151 lb (68.5 kg)  04/07/13 141 lb (64 kg)  12/23/10 146 lb (66.2 kg)     There are no preventive care reminders to display for this patient.  There are no preventive care reminders to display for this patient.  Lab Results  Component Value Date   TSH 0.72 05/26/2018   Lab Results  Component Value Date   WBC 3.9 (L) 05/26/2018   HGB 15.2 05/26/2018   HCT 43.8 05/26/2018   MCV 85.4 05/26/2018   PLT 255.0 05/26/2018   Lab Results  Component Value Date   NA 143 05/26/2018   K 3.8 05/26/2018   CO2 30 05/26/2018   GLUCOSE 92 05/26/2018   BUN 22 05/26/2018   CREATININE 1.07 05/26/2018   BILITOT 0.7 05/26/2018   ALKPHOS 84 05/26/2018   AST 22 05/26/2018   ALT 28 05/26/2018   PROT 6.9 05/26/2018   ALBUMIN 4.4 05/26/2018   CALCIUM 9.1 05/26/2018   GFR 75.05 05/26/2018   Lab Results  Component Value Date   CHOL 148 05/26/2018   Lab Results  Component Value Date   HDL 49.80 05/26/2018   Lab Results  Component Value Date   LDLCALC 86 05/26/2018   Lab Results  Component Value Date   TRIG 58.0 05/26/2018   Lab Results  Component Value Date   CHOLHDL 3 05/26/2018   No results found for: HGBA1C    Assessment & Plan:   Problem List Items Addressed This Visit      Respiratory   Reactive airway disease - Primary   Relevant Medications   predniSONE (DELTASONE) 10 MG tablet      Meds ordered this encounter  Medications  . predniSONE (DELTASONE) 10 MG tablet    Sig: Take one daily just before work for 7 days    Dispense:  7 tablet    Refill:  0    Follow-up: Return in about 1 week (around 07/23/2018), or if symptoms worsen or fail to improve.    Mliss SaxWilliam Alfred , MDVirtual Visit via Video  Note  I connected with Peter White on 07/16/18 at 10:30 AM EDT by a video enabled telemedicine application and verified that I am speaking with the correct person using two identifiers.   I discussed the limitations of evaluation and management by telemedicine and the availability of in person appointments. The patient expressed understanding and  agreed to proceed.  History of Present Illness:    Observations/Objective:   Assessment and Plan:   Follow Up Instructions:    I discussed the assessment and treatment plan with the patient. The patient was provided an opportunity to ask questions and all were answered. The patient agreed with the plan and demonstrated an understanding of the instructions.   The patient was advised to call back or seek an in-person evaluation if the symptoms worsen or if the condition fails to improve as anticipated.  I provided 15 minutes of non-face-to-face time during this encounter.     Patient will continue his eyes all and take a brief course of prednisone.  He will follow-up in a week or so if not improving and may consider some low-dose Paxil for nighttime use.

## 2018-08-25 ENCOUNTER — Ambulatory Visit: Payer: No Typology Code available for payment source | Admitting: Family Medicine

## 2019-04-07 ENCOUNTER — Telehealth: Payer: No Typology Code available for payment source | Admitting: Emergency Medicine

## 2019-04-07 ENCOUNTER — Ambulatory Visit: Payer: No Typology Code available for payment source | Attending: Internal Medicine

## 2019-04-07 DIAGNOSIS — Z20822 Contact with and (suspected) exposure to covid-19: Secondary | ICD-10-CM

## 2019-04-07 DIAGNOSIS — Z1152 Encounter for screening for COVID-19: Secondary | ICD-10-CM

## 2019-04-07 MED ORDER — PROMETHAZINE-DM 6.25-15 MG/5ML PO SYRP: 5.0000 mL | ORAL_SOLUTION | Freq: Four times a day (QID) | ORAL | 0 refills | Status: DC | PRN

## 2019-04-07 MED FILL — PROMETHAZINE W/DM SYRUP: 6.25-15 | 6 days supply | Qty: 118 | Fill #0

## 2019-04-07 NOTE — Progress Notes (Addendum)
Time spent: 5 min   E-Visit for [employer] Virus Screening Peter White,   [place] sorry you are not feeling well.    Your current symptoms could be consistent with the coronavirus.  Based on what you described it does not appear you have a severe illness.  I recommend formal testing for COVID-19 infection.  Keep in mind your symptoms may be from another virus like influenza, rhinovirus like the common cold.    Many health care providers can now test patients at their office but not all are.  [place] has multiple testing sites. For information on our [place] testing locations and hours go to [email or web address]  We are enrolling you in our [facility] for [place] . Daily you will receive a questionnaire within the [place] website. Our COVID 19 response team will be monitoring your responses daily.  Testing Information: The COVID-19 Community Testing sites will begin testing BY APPOINTMENT ONLY.  You can schedule online at [email or web address]  If you do not have access to a smart phone or computer you may call [phone] for an appointment.  Testing Locations: Appointment schedule is 8 am to 3:30 pm at all sites  [facility] indoors at [address] [place] [address] [facility]  indoors at [place]. [address], [place], [place] [address] [place] indoors at [address], [place] [address]  Additional testing sites in the Community:  . For [facility] Testing sites in [facility]  FaceUpdate.uy  . For Pop-up testing sites in [place]  BowlDirectory.co.uk  . For Testing sites with regular hours https://onsms.org/[place]/  . For [place] [email or web address]  . For [facility]  BasicJet.[email or web address]  . For [facility] testing in [place] and [employer] [email or web address]  . For [facility] testing in [facility]   [email or web address]  For  more information about community testing call [phone]  Please quarantine yourself while awaiting your test results. Please stay home for 10 days with improving symptoms and until you have had 24 hours of no fever (without taking a fever reducer).  You should wear a mask or cloth face covering over your nose and mouth if you must be around other people or animals, including pets (even at home). Try to stay at least 6 feet away from other people. This will protect the people around you.  Please continue good preventive care measures, including:  frequent hand-washing, avoid touching your face, cover coughs/sneezes, stay out of crowds and keep a 6 foot distance from others.  COVID-19 is a respiratory illness with symptoms that are similar to the flu. Symptoms are typically mild to moderate, but there have been cases of severe illness and death due to the virus.   The following symptoms may appear 2-14 days after exposure: . Fever . Cough . Shortness of breath or difficulty breathing . Chills . Repeated shaking with chills . Muscle pain . Headache . Sore throat . New loss of taste or smell . Fatigue . Congestion or runny nose . Nausea or vomiting . Diarrhea  Go to the nearest hospital ED for assessment if fever/cough/breathlessness are severe or illness seems like a threat to life.  It is vitally important that if you feel that you have an infection such as this virus or any other virus that you stay home and away from places where you may spread it to others.  You should avoid contact with people age 55 and older.    You can  use medication such as A prescription cough medication called Phenergan DM 6.25 mg/15 mg. You make take one teaspoon / 5 ml every 4-6 hours as needed for cough   The main treatment approach for a viral upper respiratory infection is to treat the symptoms, support your immune system and prevent spread of illness.    Stay well-hydrated. Rest.   Here are other over the counter medications to help with symptoms: ? 600 mg ibuprofen (motrin, aleve, advil) or acetaminophen (tylenol) every 6 hours, around the clock to help with associated fevers, sore throat, headaches, generalized body aches and malaise.  ? Oxymetazoline (afrin) intranasal spray once daily for no more than 3 days to help with congestion, after 3 days you can switch to another over-the-counter nasal steroid spray such as fluticasone (flonase) ? Allergy medication (loratadine, cetirizine, etc) and phenylephrine (sudafed) help with nasal congestion, runny nose and postnasal drip.   ? Dextromethorphan (Delsym) to suppress dry cough  ? Guaifenesin (mucinex) to help with built up mucus in chest and productive cough ? Wash your hands often to prevent spread.  ? Ondansatron (Zofran) for nausea    A viral upper respiratory infection can also worsen and progress into pneumonia.  Monitor your symptoms. Return for persistent fevers, chest pain, productive cough, inability to tolerate fluids despite nausea medicines or dehydration    Reduce your risk of any infection by using the same precautions used for avoiding the common cold or flu:  Marland Kitchen Wash your hands often with soap and warm water for at least 20 seconds.  If soap and water are not readily available, use an alcohol-based hand sanitizer with at least 60% alcohol.  . If coughing or sneezing, cover your mouth and nose by coughing or sneezing into the elbow areas of your shirt or coat, into a tissue or into your sleeve (not your hands). . Avoid shaking hands with others and consider  head nods or verbal greetings only. . Avoid touching your eyes, nose, or mouth with unwashed hands.  . Avoid close contact with people who are sick. . Avoid places or events with large numbers of people in one location, like concerts or sporting events. . Carefully consider travel plans you have or are making. . If you are planning any travel outside or inside the [place], visit the CDC's Travelers' Health webpage for the latest health notices. . If you have some symptoms but not all symptoms, continue to monitor at home and seek medical attention if your symptoms worsen. . If you are having a medical emergency, call 911.  HOME CARE . Only take medications as instructed by your medical team. . Drink plenty of fluids and get plenty of rest. . A steam or ultrasonic humidifier can help if you have congestion.   GET HELP RIGHT AWAY IF YOU HAVE EMERGENCY WARNING SIGNS** FOR COVID-19. If you or someone is showing any of these signs seek emergency medical care immediately. Call 911 or proceed to your closest emergency facility if: . You develop worsening high fever. . Trouble breathing . Bluish lips or face . Persistent pain or pressure in the chest . New confusion . Inability to wake or stay awake . You cough up blood. . Your symptoms become more severe  **This list is not all possible symptoms. Contact your medical provider for any symptoms that are sever or concerning to you.  MAKE SURE YOU   Understand these instructions.  Will watch your condition.  Will get help right away if  you are not doing well or get worse.  Your e-visit answers were reviewed by a board certified advanced clinical practitioner to complete your personal care plan.  Depending on the condition, your plan could have included both over the counter or prescription medications.  If there is a problem please reply once you have received a response from your provider.  Your safety is important to [place].  If you have drug  allergies check your prescription carefully.    You can use MyChart to ask questions about today's visit, request a non-urgent call back, or ask for a work or school excuse for 24 hours related to this e-Visit. If it has been greater than 24 hours you will need to follow up with your provider, or enter a new e-Visit to address those concerns. You will get an e-mail in the next two days asking about your experience.  I hope that your e-visit has been valuable and will speed your recovery. Thank you for using e-visits.   I hope you feel better soon,   Sharen Heck, [facility]

## 2019-04-09 LAB — NOVEL CORONAVIRUS, NAA: SARS-CoV-2, NAA: DETECTED — AB

## 2019-04-19 ENCOUNTER — Telehealth (INDEPENDENT_AMBULATORY_CARE_PROVIDER_SITE_OTHER): Payer: No Typology Code available for payment source | Admitting: Family Medicine

## 2019-04-19 ENCOUNTER — Encounter: Payer: Self-pay | Admitting: Family Medicine

## 2019-04-19 VITALS — Ht 69.0 in

## 2019-04-19 DIAGNOSIS — U071 COVID-19: Secondary | ICD-10-CM

## 2019-04-19 NOTE — Progress Notes (Signed)
Established Patient Office Visit  Subjective:  Patient ID: Peter White, male    DOB: [birth year]  Age: 44 y.o. MRN: [id number]  CC:  Chief Complaint  Patient presents with  . Follow-up    f/u on positive covid, pt states that he is symptoms free and feeling pretty good.     HPI Peter White presents for follow-up of his recent Covid infection.  Symptoms started back on 5 January with significant myalgias.  Tested positive for the illness on the seventh.  He has been quarantined at home since that time.  He has done well in all but recovered.  Energy levels are returning to normal.  Denies any headache, changes in mentation, chest pain or shortness of breath.  Wife is currently in hospital [facility] the [facility].  She has a history of asthma.  Out of their 3 children only the oldest developed any symptoms.  He had nausea and vomiting for 1 day and then seemed to recover.  The other 2 children are asymptomatic.  Past Medical History:  Diagnosis Date  . Allergy   . Chicken pox   . Frequent headaches     Past Surgical History:  Procedure Laterality Date  . TONSILLECTOMY      Family History  Problem Relation Age of Onset  . Heart disease Father   . Rheum arthritis Father   . Arthritis Father   . Hyperlipidemia Father   . Hypertension Father   . Depression Mother   . Hyperlipidemia Mother   . Hypertension Mother     Social History   Socioeconomic History  . Marital status: Married    Spouse name: Not on file  . Number of children: Not on file  . Years of education: Not on file  . Highest education level: Not on file  Occupational History  . Not on file  Tobacco Use  . Smoking status: Never Smoker  . Smokeless tobacco: Never Used  Substance and Sexual Activity  . Alcohol use: No  . Drug use: No  . Sexual activity: Yes    Partners: Female  Other Topics Concern  . Not on file  Social History Narrative  . Not on file   Social Determinants of Health     Financial Resource Strain:   . Difficulty of Paying Living Expenses: Not on file  Food Insecurity:   . Worried About Programme researcher, broadcasting/film/video in the Last Year: Not on file  . Ran Out of Food in the Last Year: Not on file  Transportation Needs:   . Lack of Transportation (Medical): Not on file  . Lack of Transportation (Non-Medical): Not on file  Physical Activity:   . Days of Exercise per Week: Not on file  . Minutes of Exercise per Session: Not on file  Stress:   . Feeling of Stress : Not on file  Social Connections:   . Frequency of Communication with Friends and Family: Not on file  . Frequency of Social Gatherings with Friends and Family: Not on file  . Attends Religious Services: Not on file  . Active Member of Clubs or Organizations: Not on file  . Attends Banker Meetings: Not on file  . Marital Status: Not on file  Intimate Partner Violence:   . Fear of Current or Ex-Partner: Not on file  . Emotionally Abused: Not on file  . Physically Abused: Not on file  . Sexually Abused: Not on file    Outpatient  Medications Prior to Visit  Medication Sig Dispense Refill  . predniSONE (DELTASONE) 10 MG tablet Take one daily just before work for 7 days (Patient not taking: Reported on 04/19/2019) 7 tablet 0  . promethazine-dextromethorphan (PROMETHAZINE-DM) 6.25-15 MG/5ML syrup Take 5 mLs by mouth 4 (four) times daily as needed for cough. (Patient not taking: Reported on 04/19/2019) 118 mL 0   No facility-administered medications prior to visit.    No Known Allergies  ROS Review of Systems  Constitutional: Negative for appetite change, chills, diaphoresis, fatigue, fever and unexpected weight change.  HENT: Negative.   Eyes: Negative for photophobia and visual disturbance.  Respiratory: Negative for chest tightness, shortness of breath and wheezing.   Cardiovascular: Negative for chest pain and palpitations.  Gastrointestinal: Negative for diarrhea, nausea and  vomiting.  Musculoskeletal: Negative for arthralgias and myalgias.  Skin: Negative for rash.  Neurological: Negative for headaches.  Hematological: Negative.   Psychiatric/Behavioral: Negative.       Objective:    Physical Exam  Constitutional: He is oriented to person, place, and time. He appears well-developed and well-nourished. No distress.  HENT:  Head: Normocephalic and atraumatic.  Left Ear: External ear normal.  Eyes: Conjunctivae are normal. Right eye exhibits no discharge. Left eye exhibits no discharge. No scleral icterus.  Neck: No JVD present. No tracheal deviation present.  Pulmonary/Chest: Effort normal. No stridor.  Neurological: He is alert and oriented to person, place, and time.  Skin: He is not diaphoretic.  Psychiatric: He has a normal mood and affect. His behavior is normal.    Ht 5\' 9"  (1.753 m)   BMI 22.30 kg/m  Wt Readings from Last 3 Encounters:  05/26/18 151 lb (68.5 kg)  04/07/13 141 lb (64 kg)  12/23/10 146 lb (66.2 kg)     Health Maintenance Due  Topic Date Due  . TETANUS/TDAP  09/20/2018  . INFLUENZA VACCINE  10/30/2018    There are no preventive care reminders to display for this patient.  Lab Results  Component Value Date   TSH 0.72 05/26/2018   Lab Results  Component Value Date   WBC 3.9 (L) 05/26/2018   HGB 15.2 05/26/2018   HCT 43.8 05/26/2018   MCV 85.4 05/26/2018   PLT 255.0 05/26/2018   Lab Results  Component Value Date   NA 143 05/26/2018   K 3.8 05/26/2018   CO2 30 05/26/2018   GLUCOSE 92 05/26/2018   BUN 22 05/26/2018   CREATININE 1.07 05/26/2018   BILITOT 0.7 05/26/2018   ALKPHOS 84 05/26/2018   AST 22 05/26/2018   ALT 28 05/26/2018   PROT 6.9 05/26/2018   ALBUMIN 4.4 05/26/2018   CALCIUM 9.1 05/26/2018   GFR 75.05 05/26/2018   Lab Results  Component Value Date   CHOL 148 05/26/2018   Lab Results  Component Value Date   HDL 49.80 05/26/2018   Lab Results  Component Value Date   LDLCALC 86  05/26/2018   Lab Results  Component Value Date   TRIG 58.0 05/26/2018   Lab Results  Component Value Date   CHOLHDL 3 05/26/2018   No results found for: HGBA1C    Assessment & Plan:   Problem List Items Addressed This Visit    None    Visit Diagnoses    COVID-19    -  Primary      No orders of the defined types were placed in this encounter.   Follow-up: Return for physical.  Advised patient that he could return  to work.  Advised him to receive the Covid vaccine when it becomes available to him.  We will check him for SARS Covid 19 antibodies at his next physical. Mliss Sax, MD   Virtual Visit via Video Note  I connected with Tedra Coupe on 04/19/19 at  1:00 PM EST by a video enabled telemedicine application and verified that I am speaking with the correct person using two identifiers.  Location: Patient: home with his 3 children.  Provider:    I discussed the limitations of evaluation and management by telemedicine and the availability of in person appointments. The patient expressed understanding and agreed to proceed.  History of Present Illness:    Observations/Objective:   Assessment and Plan:   Follow Up Instructions:    I discussed the assessment and treatment plan with the patient. The patient was provided an opportunity to ask questions and all were answered. The patient agreed with the plan and demonstrated an understanding of the instructions.   The patient was advised to call back or seek an in-person evaluation if the symptoms worsen or if the condition fails to improve as anticipated.  I provided 20 minutes of non-face-to-face time during this encounter.   Mliss Sax, MD

## 2019-07-08 ENCOUNTER — Encounter: Payer: Self-pay | Admitting: Sports Medicine

## 2019-07-08 ENCOUNTER — Other Ambulatory Visit: Payer: Self-pay

## 2019-07-08 ENCOUNTER — Ambulatory Visit (INDEPENDENT_AMBULATORY_CARE_PROVIDER_SITE_OTHER): Payer: No Typology Code available for payment source

## 2019-07-08 ENCOUNTER — Ambulatory Visit (INDEPENDENT_AMBULATORY_CARE_PROVIDER_SITE_OTHER): Payer: No Typology Code available for payment source | Admitting: Sports Medicine

## 2019-07-08 ENCOUNTER — Other Ambulatory Visit: Payer: Self-pay | Admitting: Sports Medicine

## 2019-07-08 DIAGNOSIS — M7742 Metatarsalgia, left foot: Secondary | ICD-10-CM

## 2019-07-08 DIAGNOSIS — M79672 Pain in left foot: Secondary | ICD-10-CM

## 2019-07-08 NOTE — Assessment & Plan Note (Signed)
For a long time this pleasant 44 year old male has noted pain in his left foot, plantar aspect just behind the second metatarsal head. On exam this is consistent with metatarsalgia. No tenderness at the MTP dorsally or over the metatarsal shaft. I added a large metatarsal pad and he reported resolution of his pain. He will put another pad in his work shoes and return to see me if no better in 2 weeks, we will consider second MTP injection at that time.

## 2019-07-08 NOTE — Progress Notes (Signed)
Left foot pain. No known injury.

## 2019-07-08 NOTE — Progress Notes (Signed)
    Procedures performed today:    None.  Independent interpretation of notes and tests performed by another provider:   X-rays personally reviewed and are unremarkable with the exception of possibly mild first MTP arthritis, not clinically significant.  Brief History, Exam, Impression, and Recommendations:    Metatarsalgia, left foot For a long time this pleasant 44 year old male has noted pain in his left foot, plantar aspect just behind the second metatarsal head. On exam this is consistent with metatarsalgia. No tenderness at the MTP dorsally or over the metatarsal shaft. I added a large metatarsal pad and he reported resolution of his pain. He will put another pad in his work shoes and return to see me if no better in 2 weeks, we will consider second MTP injection at that time.    ___________________________________________ Ihor Austin. Benjamin Stain, M.D., ABFM., CAQSM. Primary Care and Sports Medicine [facility]  Adjunct Instructor of Family Medicine  [employer]

## 2020-02-15 DIAGNOSIS — M7742 Metatarsalgia, left foot: Secondary | ICD-10-CM

## 2020-08-01 IMAGING — DX DG FOOT COMPLETE 3+V*L*
3 series · 3 of 3 positions shown · non-contrast
Comparison: None.

CLINICAL DATA: Left foot pain with weight-bearing for 3 weeks. No
known injury.

EXAM:
LEFT FOOT - COMPLETE 3+ VIEW

[foot ap]
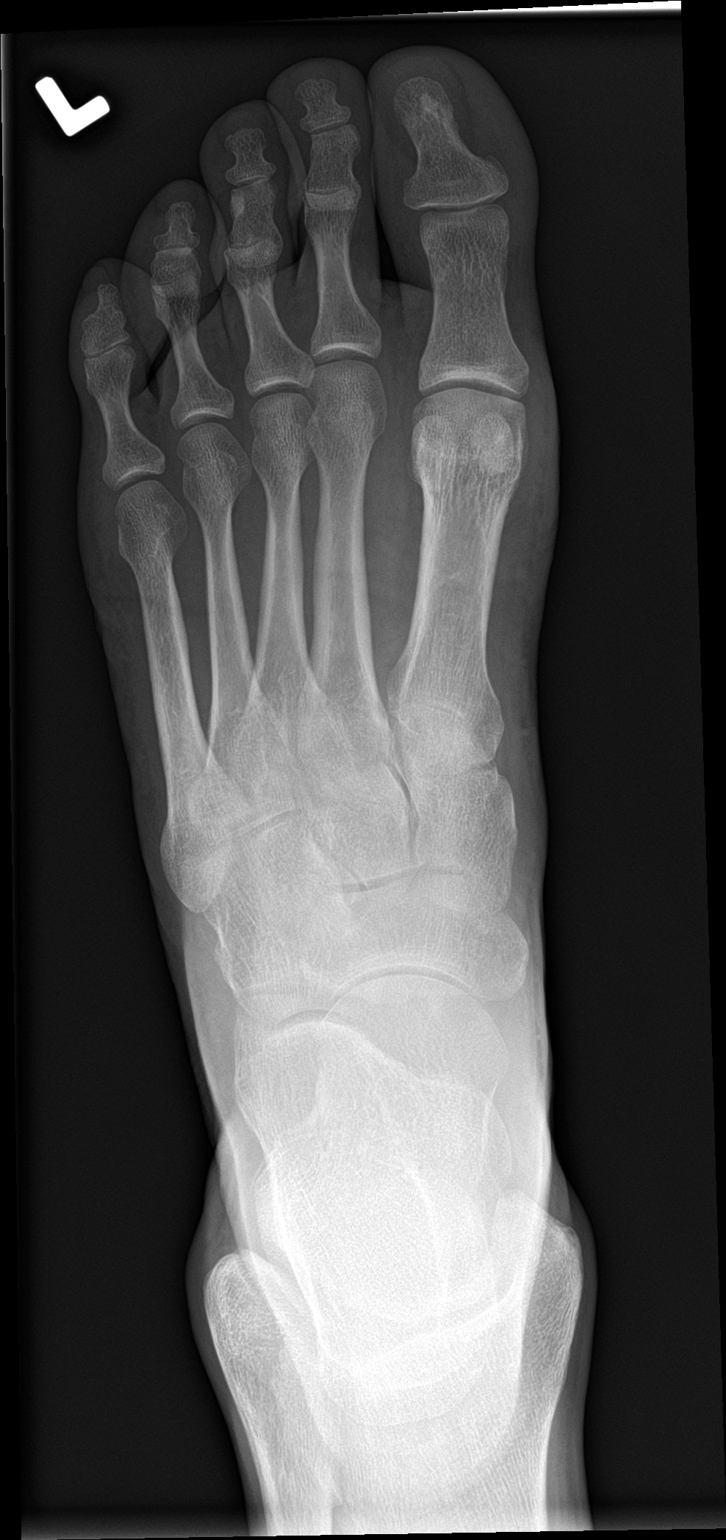

[foot obl]
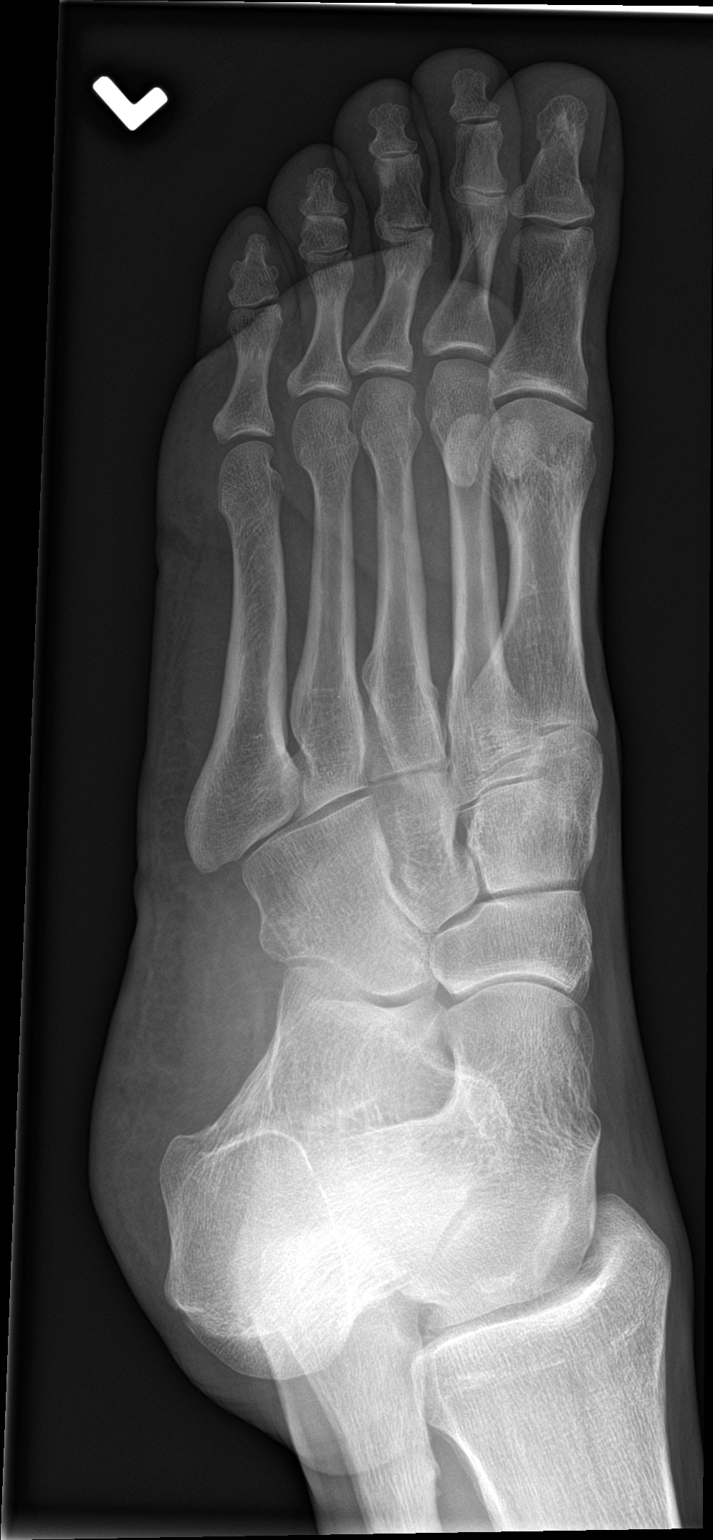

[foot lat]
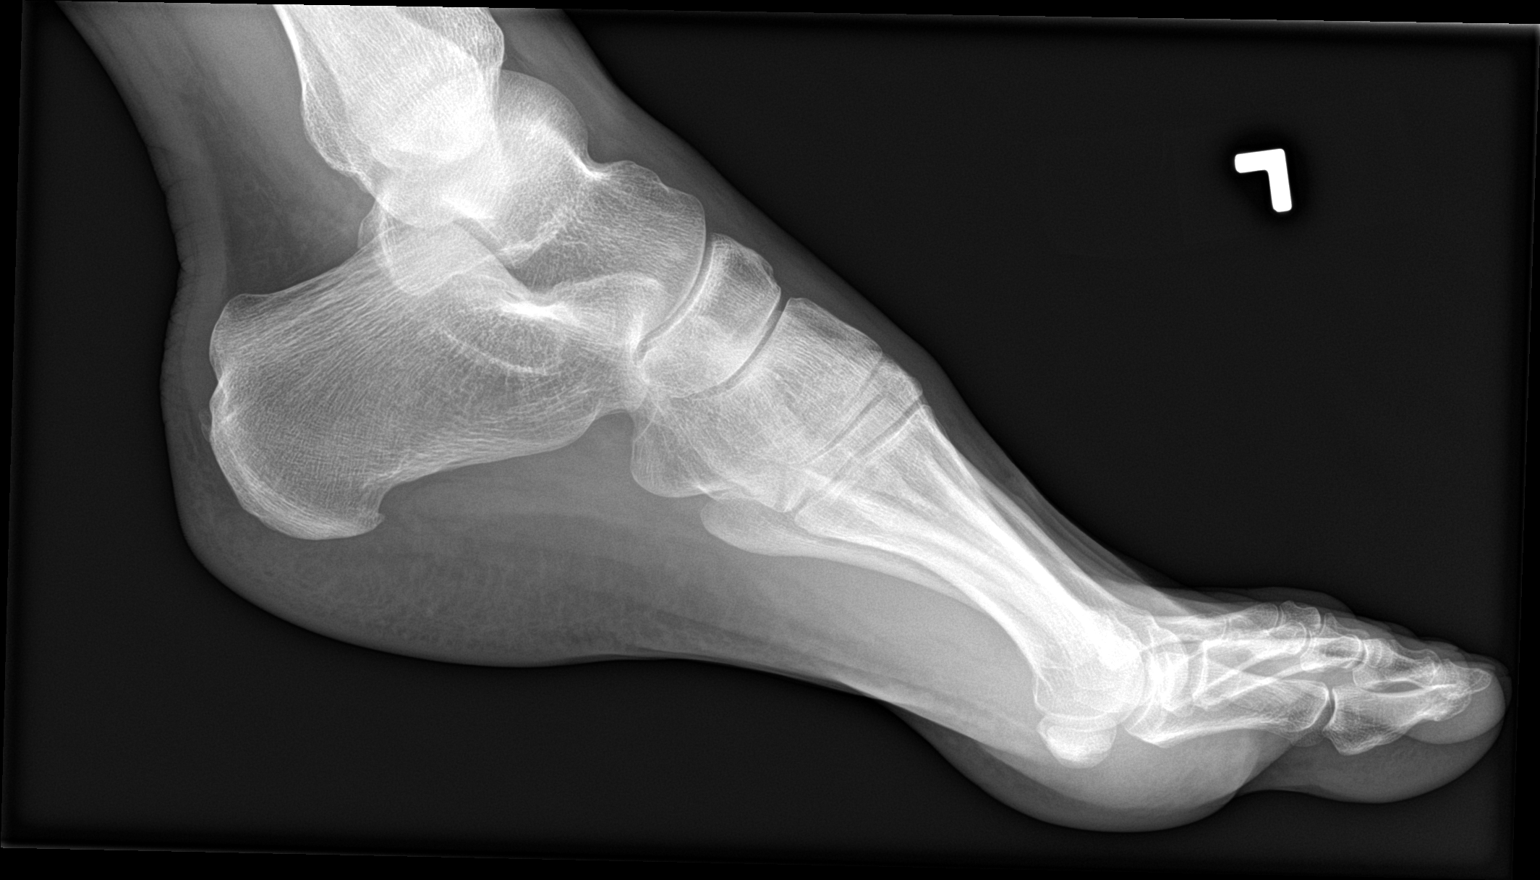

[3 of 3 positions shown; findings below may reference images not displayed]

FINDINGS: The mineralization and alignment are normal. There is no evidence of
acute fracture or dislocation. Mild joint space narrowing at the 1st
MTP joint. The additional joint spaces are preserved. No focal soft
tissue abnormalities are seen.
IMPRESSION: Mild 1st MTP degenerative changes. No acute osseous findings.

## 2020-11-22 ENCOUNTER — Other Ambulatory Visit: Payer: Self-pay

## 2020-11-22 ENCOUNTER — Ambulatory Visit (INDEPENDENT_AMBULATORY_CARE_PROVIDER_SITE_OTHER): Payer: No Typology Code available for payment source | Admitting: Family Medicine

## 2020-11-22 ENCOUNTER — Encounter: Payer: Self-pay | Admitting: Family Medicine

## 2020-11-22 VITALS — BP 116/68 | HR 70 | Temp 98.0°F | Ht 69.0 in | Wt 159.8 lb

## 2020-11-22 DIAGNOSIS — Z0000 Encounter for general adult medical examination without abnormal findings: Secondary | ICD-10-CM | POA: Diagnosis not present

## 2020-11-22 NOTE — Progress Notes (Signed)
Established Patient Office Visit  Subjective:  Patient ID: Peter White, male    DOB: 06/07/1975  Age: 45 y.o. MRN: [id number]  CC:  Chief Complaint  Patient presents with   Annual Exam    CPE, no concerns. Patient not fasting.     HPI Peter White presents for physical exam.  Continues to do well.  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  He has no complaints today.  He is accompanied by his young son having who is a third grader at the [employer].  He is nonfasting.  Past Medical History:  Diagnosis Date   Allergy    Chicken pox    Frequent headaches     Past Surgical History:  Procedure Laterality Date   TONSILLECTOMY      Family History  Problem Relation Age of Onset   Depression Mother    Hyperlipidemia Mother    Hypertension Mother    Heart disease Father    Rheum arthritis Father    Arthritis Father    Hyperlipidemia Father    Hypertension Father     Social History   Socioeconomic History   Marital status: Married    Spouse name: Not on file   Number of children: Not on file   Years of education: Not on file   Highest education level: Not on file  Occupational History   Not on file  Tobacco Use   Smoking status: Never   Smokeless tobacco: Never  Vaping Use   Vaping Use: Never used  Substance and Sexual Activity   Alcohol use: No   Drug use: No   Sexual activity: Yes    Partners: Female  Other Topics Concern   Not on file  Social History Narrative   Not on file   Social Determinants of Health   Financial Resource Strain: Not on file  Food Insecurity: Not on file  Transportation Needs: Not on file  Physical Activity: Not on file  Stress: Not on file  Social Connections: Not on file  Intimate Partner Violence: Not on file    No outpatient medications prior to visit.   No facility-administered medications prior to visit.    Allergies  Allergen Reactions   Percocet [Oxycodone-Acetaminophen]  Other (See Comments)    Pt reported dizziness and shallow breathing    ROS Review of Systems  Constitutional:  Negative for diaphoresis, fatigue, fever and unexpected weight change.  HENT: Negative.    Eyes:  Negative for photophobia and visual disturbance.  Respiratory: Negative.    Cardiovascular: Negative.   Gastrointestinal: Negative.  Negative for abdominal pain, anal bleeding, blood in stool and constipation.  Endocrine: Negative for polyphagia and polyuria.  Genitourinary:  Negative for difficulty urinating, frequency and urgency.  Musculoskeletal:  Negative for gait problem and joint swelling.  Skin: Negative.   Neurological:  Negative for speech difficulty and weakness.     Depression screen [facility] [place] 2/9 11/22/2020 11/22/2020 04/19/2019  Decreased Interest 0 0 0  Down, Depressed, Hopeless 0 0 0  PHQ - 2 Score 0 0 0  Altered sleeping 0 - -  Tired, decreased energy 0 - -  Change in appetite 0 - -  Feeling bad or failure about yourself  0 - -  Trouble concentrating 0 - -  Moving slowly or fidgety/restless 0 - -  Suicidal thoughts 0 - -  PHQ-9 Score 0 - -  Difficult doing work/chores Not difficult at all - -  Objective:    Physical Exam Vitals and nursing note reviewed.  Constitutional:      General: He is not in acute distress.    Appearance: Normal appearance. He is normal weight. He is not ill-appearing, toxic-appearing or diaphoretic.  HENT:     Head: Normocephalic and atraumatic.     Right Ear: Tympanic membrane, ear canal and external ear normal.     Left Ear: Tympanic membrane, ear canal and external ear normal.     Mouth/Throat:     Mouth: Mucous membranes are moist.     Pharynx: Oropharynx is clear. No oropharyngeal exudate or posterior oropharyngeal erythema.  Eyes:     General: No scleral icterus.       Right eye: No discharge.        Left eye: No discharge.     Extraocular Movements: Extraocular movements intact.     Conjunctiva/sclera: Conjunctivae  normal.     Pupils: Pupils are equal, round, and reactive to light.  Neck:     Vascular: No carotid bruit.  Cardiovascular:     Rate and Rhythm: Normal rate and regular rhythm.  Pulmonary:     Effort: Pulmonary effort is normal.     Breath sounds: Normal breath sounds.  Abdominal:     General: Abdomen is flat. Bowel sounds are normal. There is no distension.     Palpations: There is no mass.     Tenderness: There is no abdominal tenderness. There is no guarding or rebound.     Hernia: No hernia is present.  Musculoskeletal:     Cervical back: No rigidity or tenderness.  Lymphadenopathy:     Cervical: No cervical adenopathy.  Skin:    General: Skin is warm and dry.  Neurological:     Mental Status: He is alert and oriented to person, place, and time.  Psychiatric:        Mood and Affect: Mood normal.        Behavior: Behavior normal.    BP 116/68 (BP Location: Right Arm, Patient Position: Sitting, Cuff Size: Normal)   Pulse 70   Temp 98 F (36.7 C) (Temporal)   Ht 5\' 9"  (1.753 m)   Wt 159 lb 12.8 oz (72.5 kg)   SpO2 99%   BMI 23.60 kg/m  Wt Readings from Last 3 Encounters:  11/22/20 159 lb 12.8 oz (72.5 kg)  07/08/19 153 lb (69.4 kg)  05/26/18 151 lb (68.5 kg)     Health Maintenance Due  Topic Date Due   Hepatitis C Screening  Never done   TETANUS/TDAP  09/20/2018   COLONOSCOPY (Pts 45-16yrs Insurance coverage will need to be confirmed)  Never done   INFLUENZA VACCINE  10/29/2020    There are no preventive care reminders to display for this patient.  Lab Results  Component Value Date   TSH 0.72 05/26/2018   Lab Results  Component Value Date   WBC 3.9 (L) 05/26/2018   HGB 15.2 05/26/2018   HCT 43.8 05/26/2018   MCV 85.4 05/26/2018   PLT 255.0 05/26/2018   Lab Results  Component Value Date   NA 143 05/26/2018   K 3.8 05/26/2018   CO2 30 05/26/2018   GLUCOSE 92 05/26/2018   BUN 22 05/26/2018   CREATININE 1.07 05/26/2018   BILITOT 0.7 05/26/2018    ALKPHOS 84 05/26/2018   AST 22 05/26/2018   ALT 28 05/26/2018   PROT 6.9 05/26/2018   ALBUMIN 4.4 05/26/2018   CALCIUM 9.1 05/26/2018  GFR 75.05 05/26/2018   Lab Results  Component Value Date   CHOL 148 05/26/2018   Lab Results  Component Value Date   HDL 49.80 05/26/2018   Lab Results  Component Value Date   LDLCALC 86 05/26/2018   Lab Results  Component Value Date   TRIG 58.0 05/26/2018   Lab Results  Component Value Date   CHOLHDL 3 05/26/2018   No results found for: HGBA1C    Assessment & Plan:   Problem List Items Addressed This Visit       Other   Healthcare maintenance - Primary   Relevant Orders   CBC   Comprehensive metabolic panel   Lipid panel   Urinalysis, Routine w reflex microscopic   Ambulatory referral to Gastroenterology    No orders of the defined types were placed in this encounter.   Follow-up: Return Return fasting for blood work.Almira Coaster to go for consultation for colonoscopy.  Was given information on health maintenance and disease prevention.  Recommended follow-up for pending results of fasting blood work. Mliss Sax, MD

## 2020-11-28 ENCOUNTER — Other Ambulatory Visit: Payer: Self-pay

## 2020-11-28 ENCOUNTER — Other Ambulatory Visit (INDEPENDENT_AMBULATORY_CARE_PROVIDER_SITE_OTHER): Payer: No Typology Code available for payment source

## 2020-11-28 DIAGNOSIS — Z0000 Encounter for general adult medical examination without abnormal findings: Secondary | ICD-10-CM

## 2020-11-28 LAB — URINALYSIS, ROUTINE W REFLEX MICROSCOPIC
Bilirubin Urine: NEGATIVE
Hgb urine dipstick: NEGATIVE
Ketones, ur: NEGATIVE
Leukocytes,Ua: NEGATIVE
Nitrite: NEGATIVE
RBC / HPF: NONE SEEN (ref 0–?)
Specific Gravity, Urine: 1.025 (ref 1.000–1.030)
Total Protein, Urine: NEGATIVE
Urine Glucose: NEGATIVE
Urobilinogen, UA: 0.2 (ref 0.0–1.0)
pH: 6 (ref 5.0–8.0)

## 2020-11-28 LAB — COMPREHENSIVE METABOLIC PANEL
ALT: 19 U/L (ref 0–53)
AST: 17 U/L (ref 0–37)
Albumin: 4.3 g/dL (ref 3.5–5.2)
Alkaline Phosphatase: 82 U/L (ref 39–117)
BUN: 15 mg/dL (ref 6–23)
CO2: 30 mEq/L (ref 19–32)
Calcium: 9.2 mg/dL (ref 8.4–10.5)
Chloride: 107 mEq/L (ref 96–112)
Creatinine, Ser: 1.03 mg/dL (ref 0.40–1.50)
GFR: 87.07 mL/min (ref >60.00)
Glucose, Bld: 85 mg/dL (ref 70–99)
Potassium: 3.8 mEq/L (ref 3.5–5.1)
Sodium: 142 mEq/L (ref 135–145)
Total Bilirubin: 0.7 mg/dL (ref 0.2–1.2)
Total Protein: 6.5 g/dL (ref 6.0–8.3)

## 2020-11-28 LAB — LIPID PANEL
Cholesterol: 150 mg/dL (ref 0–200)
HDL: 45 mg/dL (ref >39.00)
LDL Cholesterol: 94 mg/dL (ref 0–99)
NonHDL: 104.56
Total CHOL/HDL Ratio: 3
Triglycerides: 55 mg/dL (ref 0.0–149.0)
VLDL: 11 mg/dL (ref 0.0–40.0)

## 2020-11-28 LAB — CBC
HCT: 42.4 % (ref 39.0–52.0)
Hemoglobin: 14.2 g/dL (ref 13.0–17.0)
MCHC: 33.6 g/dL (ref 30.0–36.0)
MCV: 86.1 fl (ref 78.0–100.0)
Platelets: 270 10*3/uL (ref 150.0–400.0)
RBC: 4.92 Mil/uL (ref 4.22–5.81)
RDW: 13.4 % (ref 11.5–15.5)
WBC: 3.5 10*3/uL — ABNORMAL LOW (ref 4.0–10.5)

## 2021-02-04 ENCOUNTER — Encounter: Payer: Self-pay | Admitting: Family Medicine

## 2022-05-13 ENCOUNTER — Ambulatory Visit
Admission: EM | Admit: 2022-05-13 | Discharge: 2022-05-13 | Disposition: A | Discharge status: Home / Self Care | Payer: Federal, State, Local not specified - PPO | Attending: Family Medicine | Admitting: Family Medicine

## 2022-05-13 ENCOUNTER — Other Ambulatory Visit (HOSPITAL_BASED_OUTPATIENT_CLINIC_OR_DEPARTMENT_OTHER): Payer: Self-pay

## 2022-05-13 DIAGNOSIS — J111 Influenza due to unidentified influenza virus with other respiratory manifestations: Secondary | ICD-10-CM

## 2022-05-13 MED ORDER — OSELTAMIVIR PHOSPHATE 75 MG PO CAPS
75.0000 mg | ORAL_CAPSULE | Freq: Two times a day (BID) | ORAL | 0 refills | Status: DC
  Filled 2022-05-13: qty 10, 5d supply, fill #0

## 2022-05-13 NOTE — ED Provider Notes (Signed)
[facility]    CSN: [id number] Arrival date & time: 05/13/22  0903      History   Chief Complaint Chief Complaint  Patient presents with   Chills   Cough   Diarrhea    HPI Peter White is a 47 y.o. male.   He is here for flu symptoms.  Two of his kids had the flu last week.  He started with symptoms yesterday with fever, body aches, coughing, little diarrhea.  Fever is low grade, but using tylenol and theraflu.  No wheezing or sob noted.        Past Medical History:  Diagnosis Date   Allergy    Chicken pox    Frequent headaches     Patient Active Problem List   Diagnosis Date Noted   Metatarsalgia, left foot 07/08/2019   Reactive airway disease 07/16/2018   Healthcare maintenance 05/26/2018   Arthralgia of both elbows 05/26/2018   Chronic joint pain 05/26/2018   GASTROENTERITIS 06/26/2009    Past Surgical History:  Procedure Laterality Date   TONSILLECTOMY         Home Medications    Prior to Admission medications   Not on File    Family History Family History  Problem Relation Age of Onset   Depression Mother    Hyperlipidemia Mother    Hypertension Mother    Heart disease Father    Rheum arthritis Father    Arthritis Father    Hyperlipidemia Father    Hypertension Father     Social History Social History   Tobacco Use   Smoking status: Never   Smokeless tobacco: Never  Vaping Use   Vaping Use: Never used  Substance Use Topics   Alcohol use: No   Drug use: No     Allergies   Percocet [oxycodone-acetaminophen]   Review of Systems Review of Systems  Constitutional:  Positive for chills, fatigue and fever.  HENT:  Positive for congestion and rhinorrhea.   Respiratory:  Positive for cough. Negative for shortness of breath and wheezing.   Gastrointestinal:  Positive for diarrhea.  Genitourinary: Negative.   Musculoskeletal: Negative.   Psychiatric/Behavioral: Negative.       Physical Exam Triage  Vital Signs ED Triage Vitals  Enc Vitals Group     BP 05/13/22 0952 107/72     Pulse Rate 05/13/22 0952 79     Resp 05/13/22 0952 17     Temp 05/13/22 0952 97.7 F (36.5 C)     Temp Source 05/13/22 0952 Oral     SpO2 05/13/22 0952 97 %     Weight --      Height --      Head Circumference --      Peak Flow --      Pain Score 05/13/22 0951 2     Pain Loc --      Pain Edu? --      Excl. in [place]? --    No data found.  Updated Vital Signs BP 107/72 (BP Location: Left Arm)   Pulse 79   Temp 97.7 F (36.5 C) (Oral)   Resp 17   SpO2 97%   Visual Acuity Right Eye Distance:   Left Eye Distance:   Bilateral Distance:    Right Eye Near:   Left Eye Near:    Bilateral Near:     Physical Exam Constitutional:      Appearance: Normal appearance.  HENT:     Nose: Congestion present.  No rhinorrhea.     Mouth/Throat:     Mouth: Mucous membranes are moist.  Cardiovascular:     Rate and Rhythm: Normal rate.  Pulmonary:     Effort: Pulmonary effort is normal.     Breath sounds: Normal breath sounds.  Musculoskeletal:     Cervical back: Normal range of motion and neck supple. No tenderness.  Lymphadenopathy:     Cervical: No cervical adenopathy.  Skin:    General: Skin is warm.  Neurological:     General: No focal deficit present.     Mental Status: He is alert.  Psychiatric:        Mood and Affect: Mood normal.      UC Treatments / Results  Labs (all labs ordered are listed, but only abnormal results are displayed) Labs Reviewed - No data to display  EKG   Radiology No results found.  Procedures Procedures (including critical care time)  Medications Ordered in UC Medications - No data to display  Initial Impression / Assessment and Plan / UC Course  I have reviewed the triage vital signs and the nursing notes.  Pertinent labs & imaging results that were available during my care of the patient were reviewed by me and considered in my medical decision making  (see chart for details).   Final Clinical Impressions(s) / UC Diagnoses   Final diagnoses:  Influenza with respiratory manifestation     Discharge Instructions      You were diagnosed with influenza today given your symptoms and recent exposure.  I have sent out tamiflu to take twice/day x 5 days.  Please get plenty of rest and fluids.  You may continue tylenol for fever and body aches, as well as over the counter medications to help with cough and cold symptoms.      ED Prescriptions     Medication Sig Dispense Auth. Provider   oseltamivir (TAMIFLU) 75 MG capsule Take 1 capsule (75 mg total) by mouth every 12 (twelve) hours. 10 capsule Rondel Oh, MD      PDMP not reviewed this encounter.   Rondel Oh, MD 05/13/22 1004

## 2022-05-13 NOTE — Discharge Instructions (Signed)
You were diagnosed with influenza today given your symptoms and recent exposure.  I have sent out tamiflu to take twice/day x 5 days.  Please get plenty of rest and fluids.  You may continue tylenol for fever and body aches, as well as over the counter medications to help with cough and cold symptoms.

## 2022-05-13 NOTE — ED Triage Notes (Addendum)
Pt presents with chills, productive cough and diarrhea since yesterday.  Pt states 2 of his kids have had influenza B within past two weeks.

## 2023-09-01 ENCOUNTER — Other Ambulatory Visit (HOSPITAL_COMMUNITY): Payer: Self-pay

## 2023-12-14 ENCOUNTER — Telehealth: Admitting: Physician Assistant

## 2023-12-14 ENCOUNTER — Other Ambulatory Visit (HOSPITAL_COMMUNITY): Payer: Self-pay

## 2023-12-14 DIAGNOSIS — U071 COVID-19: Secondary | ICD-10-CM

## 2023-12-14 MED ORDER — BENZONATATE 100 MG PO CAPS
100.0000 mg | ORAL_CAPSULE | Freq: Three times a day (TID) | ORAL | 0 refills | Status: AC | PRN
  Filled 2023-12-14: qty 30, 5d supply, fill #0

## 2023-12-14 MED ORDER — NAPROXEN 500 MG PO TABS
500.0000 mg | ORAL_TABLET | Freq: Two times a day (BID) | ORAL | 0 refills | Status: DC
  Filled 2023-12-14: qty 30, 15d supply, fill #0

## 2023-12-14 MED ORDER — FLUTICASONE PROPIONATE 50 MCG/ACT NA SUSP
2.0000 | Freq: Every day | NASAL | 0 refills | Status: AC
  Filled 2023-12-14: qty 16, 30d supply, fill #0

## 2023-12-14 NOTE — Progress Notes (Signed)
 E-Visit  for Positive Covid Test Result   We are sorry you are not feeling well. We are here to help!  You have tested positive for COVID-19, meaning that you were infected with the novel coronavirus and could give the virus to others.  Most people with COVID-19 have mild illness and can recover at home without medical care. Do not leave your home, except to get medical care. Do not visit public areas and do not go to places where you are unable to wear a mask. It is important that you stay home  to take care for yourself and to help protect other people in your home and community.      Isolation Instructions:   You are to isolate at home until you have been fever free for at least 24 hours without a fever-reducing medication, and symptoms have been steadily improving for 24 hours. At that time,  you can end isolation but need to mask for an additional 5 days.  If you must be around other household members who do not have symptoms, you need to make sure that both you and the family members are masking consistently with a high-quality mask.  If you note any worsening of symptoms despite treatment, please seek an in-person evaluation ASAP. If you note any significant shortness of breath or any chest pain, please seek ER evaluation. Please do not delay care!   Go to the nearest hospital ED for assessment if fever/cough/breathlessness are severe or illness seems like a threat to life.    The following symptoms may appear 2-14 days after exposure: Fever Cough Shortness of breath or difficulty breathing Chills Repeated shaking with chills Muscle pain Headache Sore throat New loss of taste or smell Fatigue Congestion or runny nose Nausea or vomiting Diarrhea  You can use medication such as I have prescribed Tessalon  Perles 100 mg. You may take 1-2 capsules every 8 hours as needed for cough, I have prescribed an anti-inflammatory - Naprosyn  500 mg. Take twice daily as needed for fever or  body aches for 2 weeks, and I have prescribed Fluticasone  nasal spray 2 sprays in each nostril one time per dayasal spray 2 sprays in each nostril one time per day  You may also take acetaminophen (Tylenol) as needed for fever.  A work note has been provided for you today. It will be available under letters in your MyChart account.  HOME CARE: Only take medications as instructed by your medical team. Drink plenty of fluids and get plenty of rest. A steam or ultrasonic humidifier can help if you have congestion.   GET HELP RIGHT AWAY IF YOU HAVE EMERGENCY WARNING SIGNS.  Call 911 or proceed to your closest emergency facility if: You develop worsening high fever. Trouble breathing Bluish lips or face Persistent pain or pressure in the chest New confusion Inability to wake or stay awake You cough up blood. Your symptoms become more severe Inability to hold down food or fluids  This list is not all possible symptoms. Contact your medical provider for any symptoms that are severe or concerning to you.   Your e-visit answers were reviewed by a board certified advanced clinical practitioner to complete your personal care plan.  Depending on the condition, your plan could have included both over the counter or prescription medications.  If there is a problem please reply once you have received a response from your provider.  Your safety is important to us .  If you have drug allergies check  your prescription carefully.    You can use MyChart to ask questions about today's visit, request a non-urgent call back, or ask for a work or school excuse for 24 hours related to this e-Visit. If it has been greater than 24 hours you will need to follow up with your provider, or enter a new e-Visit to address those concerns. You will get an e-mail in the next two days asking about your experience.  I hope that your e-visit has been valuable and will speed your recovery. Thank you for using  e-visits.    I have spent 5 minutes in review of e-visit questionnaire, review and updating patient chart, medical decision making and response to patient.   Peter CHRISTELLA Dickinson, PA-C

## 2023-12-24 ENCOUNTER — Other Ambulatory Visit (HOSPITAL_COMMUNITY): Payer: Self-pay

## 2024-04-02 ENCOUNTER — Telehealth

## 2024-04-02 DIAGNOSIS — K089 Disorder of teeth and supporting structures, unspecified: Secondary | ICD-10-CM

## 2024-04-03 MED ORDER — NAPROXEN 500 MG PO TABS: 500.0000 mg | ORAL_TABLET | Freq: Two times a day (BID) | ORAL | 0 refills | Status: AC

## 2024-04-03 MED ORDER — CLINDAMYCIN HCL 300 MG PO CAPS: 300.0000 mg | ORAL_CAPSULE | Freq: Three times a day (TID) | ORAL | 0 refills | Status: AC

## 2024-04-03 NOTE — Progress Notes (Signed)
 E-Visit for Dental Pain  We are sorry that you are not feeling well.  Here is how we plan to help!  Based on what you have shared with me in the questionnaire, it sounds like you have a dental problem that needs to be evaluated by a dentist. In the meantime I have prescribed the following:  Clindamycin  300mg  3 times a day for 7 days and Naprosyn  500mg  2 times a day for 7 days for discomfort  It is imperative that you see a dentist within 10 days of this eVisit to determine the cause of the dental pain and be sure it is adequately treated  A toothache or tooth pain is caused when the nerve in the root of a tooth or surrounding a tooth is irritated. Dental (tooth) infection, decay, injury, or loss of a tooth are the most common causes of dental pain. Pain may also occur after an extraction (tooth is pulled out). Pain sometimes originates from other areas and radiates to the jaw, thus appearing to be tooth pain.Bacteria growing inside your mouth can contribute to gum disease and dental decay, both of which can cause pain. A toothache occurs from inflammation of the central portion of the tooth called pulp. The pulp contains nerve endings that are very sensitive to pain. Inflammation to the pulp or pulpitis may be caused by dental cavities, trauma, and infection.    HOME CARE:   For toothaches: Over-the-counter pain medications such as acetaminophen or ibuprofen may be used. Take these as directed on the package while you arrange for a dental appointment. Avoid very cold or hot foods, because they may make the pain worse. You may get relief from biting on a cotton ball soaked in oil of cloves. You can get oil of cloves at most drug stores.  For jaw pain:  Aspirin may be helpful for problems in the joint of the jaw in adults. If pain happens every time you open your mouth widely, the temporomandibular joint (TMJ) may be the source of the pain. Yawning or taking a large bite of food may worsen the  pain. An appointment with your doctor or dentist will help you find the cause.     GET HELP RIGHT AWAY IF:  You have a high fever or chills If you have had a recent head or face injury and develop headache, light headedness, nausea, vomiting, or other symptoms that concern you after an injury to your face or mouth, you could have a more serious injury in addition to your dental injury. A facial rash associated with a toothache: This condition may improve with medication. Contact your doctor for them to decide what is appropriate. Any jaw pain occurring with chest pain: Although jaw pain is most commonly caused by dental disease, it is sometimes referred pain from other areas. People with heart disease, especially people who have had stents placed, people with diabetes, or those who have had heart surgery may have jaw pain as a symptom of heart attack or angina. If your jaw or tooth pain is associated with lightheadedness, sweating, or shortness of breath, you should see a doctor as soon as possible. Trouble swallowing or excessive pain or bleeding from gums: If you have a history of a weakened immune system, diabetes, or steroid use, you may be more susceptible to infections. Infections can often be more severe and extensive or caused by unusual organisms. Dental and gum infections in people with these conditions may require more aggressive treatment. An abscess  may need draining or IV antibiotics, for example.  MAKE SURE YOU   Understand these instructions. Will watch your condition. Will get help right away if you are not doing well or get worse.  Thank you for choosing an e-visit.  Your e-visit answers were reviewed by a board certified advanced clinical practitioner to complete your personal care plan. Depending upon the condition, your plan could have included both over the counter or prescription medications.  Please review your pharmacy choice. Make sure the pharmacy is open so you can  pick up prescription now. If there is a problem, you may contact your provider through [employer] and have the prescription routed to another pharmacy.  Your safety is important to us . If you have drug allergies check your prescription carefully.   For the next 24 hours you can use MyChart to ask questions about today's visit, request a non-urgent call back, or ask for a work or school excuse. You will get an email in the next two days asking about your experience. I hope that your e-visit has been valuable and will speed your recovery.  I have spent 5 minutes in review of e-visit questionnaire, review and updating patient chart, medical decision making and response to patient.   Lanice Folden W Ladavion Savitz, NP
